# Patient Record
Sex: Male | Born: 1970 | Race: White | Hispanic: No | Marital: Single | Smoking: Never smoker
Health system: Southern US, Community
[De-identification: ages and names within clinical notes are randomized; demographics above are authoritative.]

## PROBLEM LIST (undated history)

## (undated) DIAGNOSIS — J189 Pneumonia, unspecified organism: Secondary | ICD-10-CM

## (undated) DIAGNOSIS — M1990 Unspecified osteoarthritis, unspecified site: Secondary | ICD-10-CM

## (undated) DIAGNOSIS — E7800 Pure hypercholesterolemia, unspecified: Secondary | ICD-10-CM

## (undated) DIAGNOSIS — J45909 Unspecified asthma, uncomplicated: Secondary | ICD-10-CM

## (undated) DIAGNOSIS — F419 Anxiety disorder, unspecified: Secondary | ICD-10-CM

## (undated) DIAGNOSIS — R7303 Prediabetes: Secondary | ICD-10-CM

## (undated) DIAGNOSIS — I10 Essential (primary) hypertension: Secondary | ICD-10-CM

## (undated) DIAGNOSIS — E119 Type 2 diabetes mellitus without complications: Secondary | ICD-10-CM

## (undated) HISTORY — PX: NASAL SINUS SURGERY: SHX719

## (undated) HISTORY — PX: BUNIONECTOMY: SHX129

## (undated) HISTORY — PX: WISDOM TOOTH EXTRACTION: SHX21

---

## 1997-09-26 DIAGNOSIS — F1020 Alcohol dependence, uncomplicated: Secondary | ICD-10-CM

## 1997-09-26 HISTORY — DX: Alcohol dependence, uncomplicated: F10.20

## 2010-09-26 HISTORY — PX: NASAL SINUS SURGERY: SHX719

## 2013-03-31 ENCOUNTER — Emergency Department: Payer: Self-pay | Admitting: Emergency Medicine

## 2014-07-16 IMAGING — CR DG CHEST 2V
1 series · 2 of 2 positions shown · non-contrast
Comparison: none

REASON FOR EXAM: fall
COMMENTS:

PROCEDURE:     DXR - DXR CHEST PA (OR AP) AND LATERAL  - April 01, 2013 [DATE]
RESULT:     The lungs are clear. The heart and pulmonary vessels are normal.
The bony and mediastinal structures are unremarkable. There is no effusion.
There is no pneumothorax or evidence of congestive failure.

[Series 1: w chest pa · 0.14mm/px · 2 of 2 slices shown]
[im 1/2]
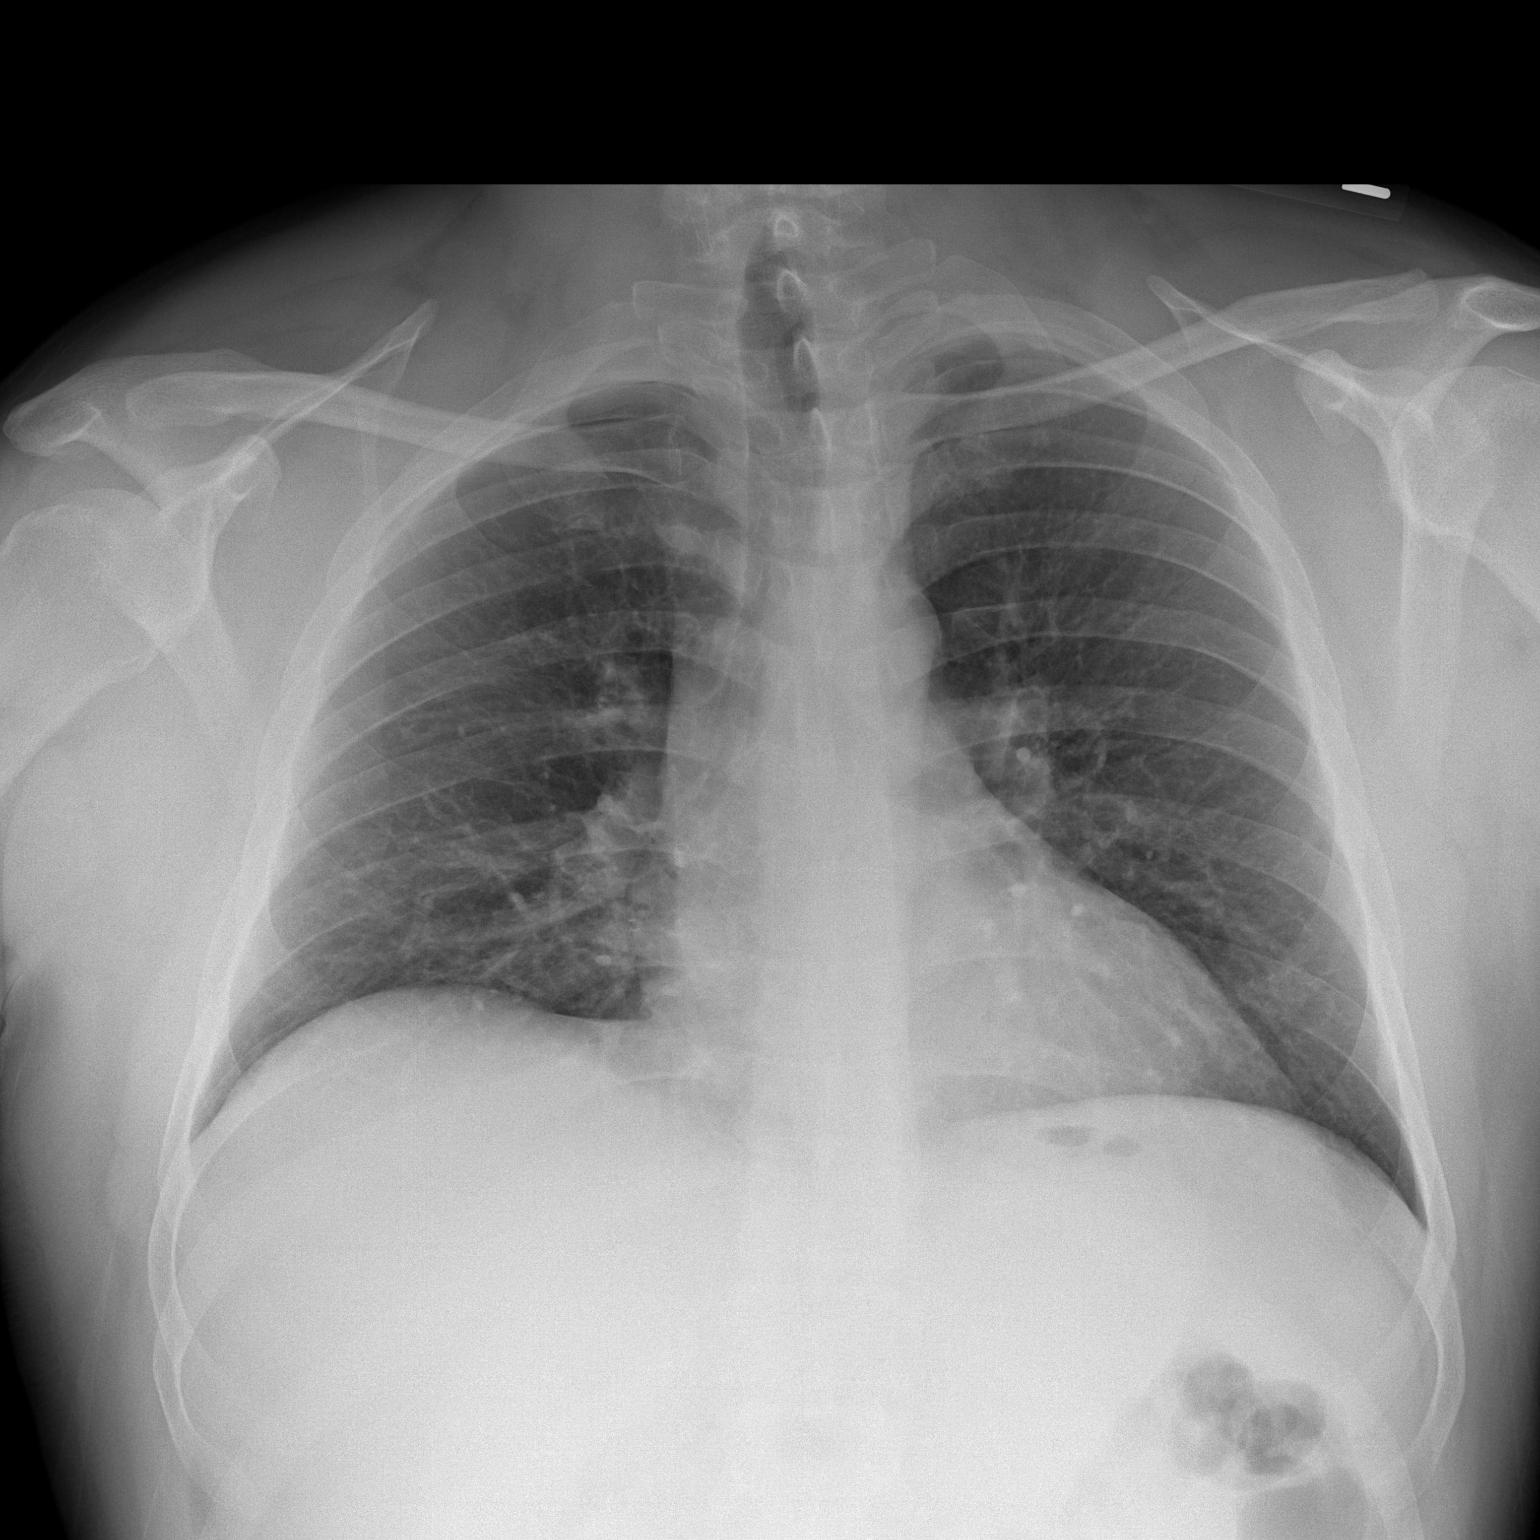
[im 2/2]
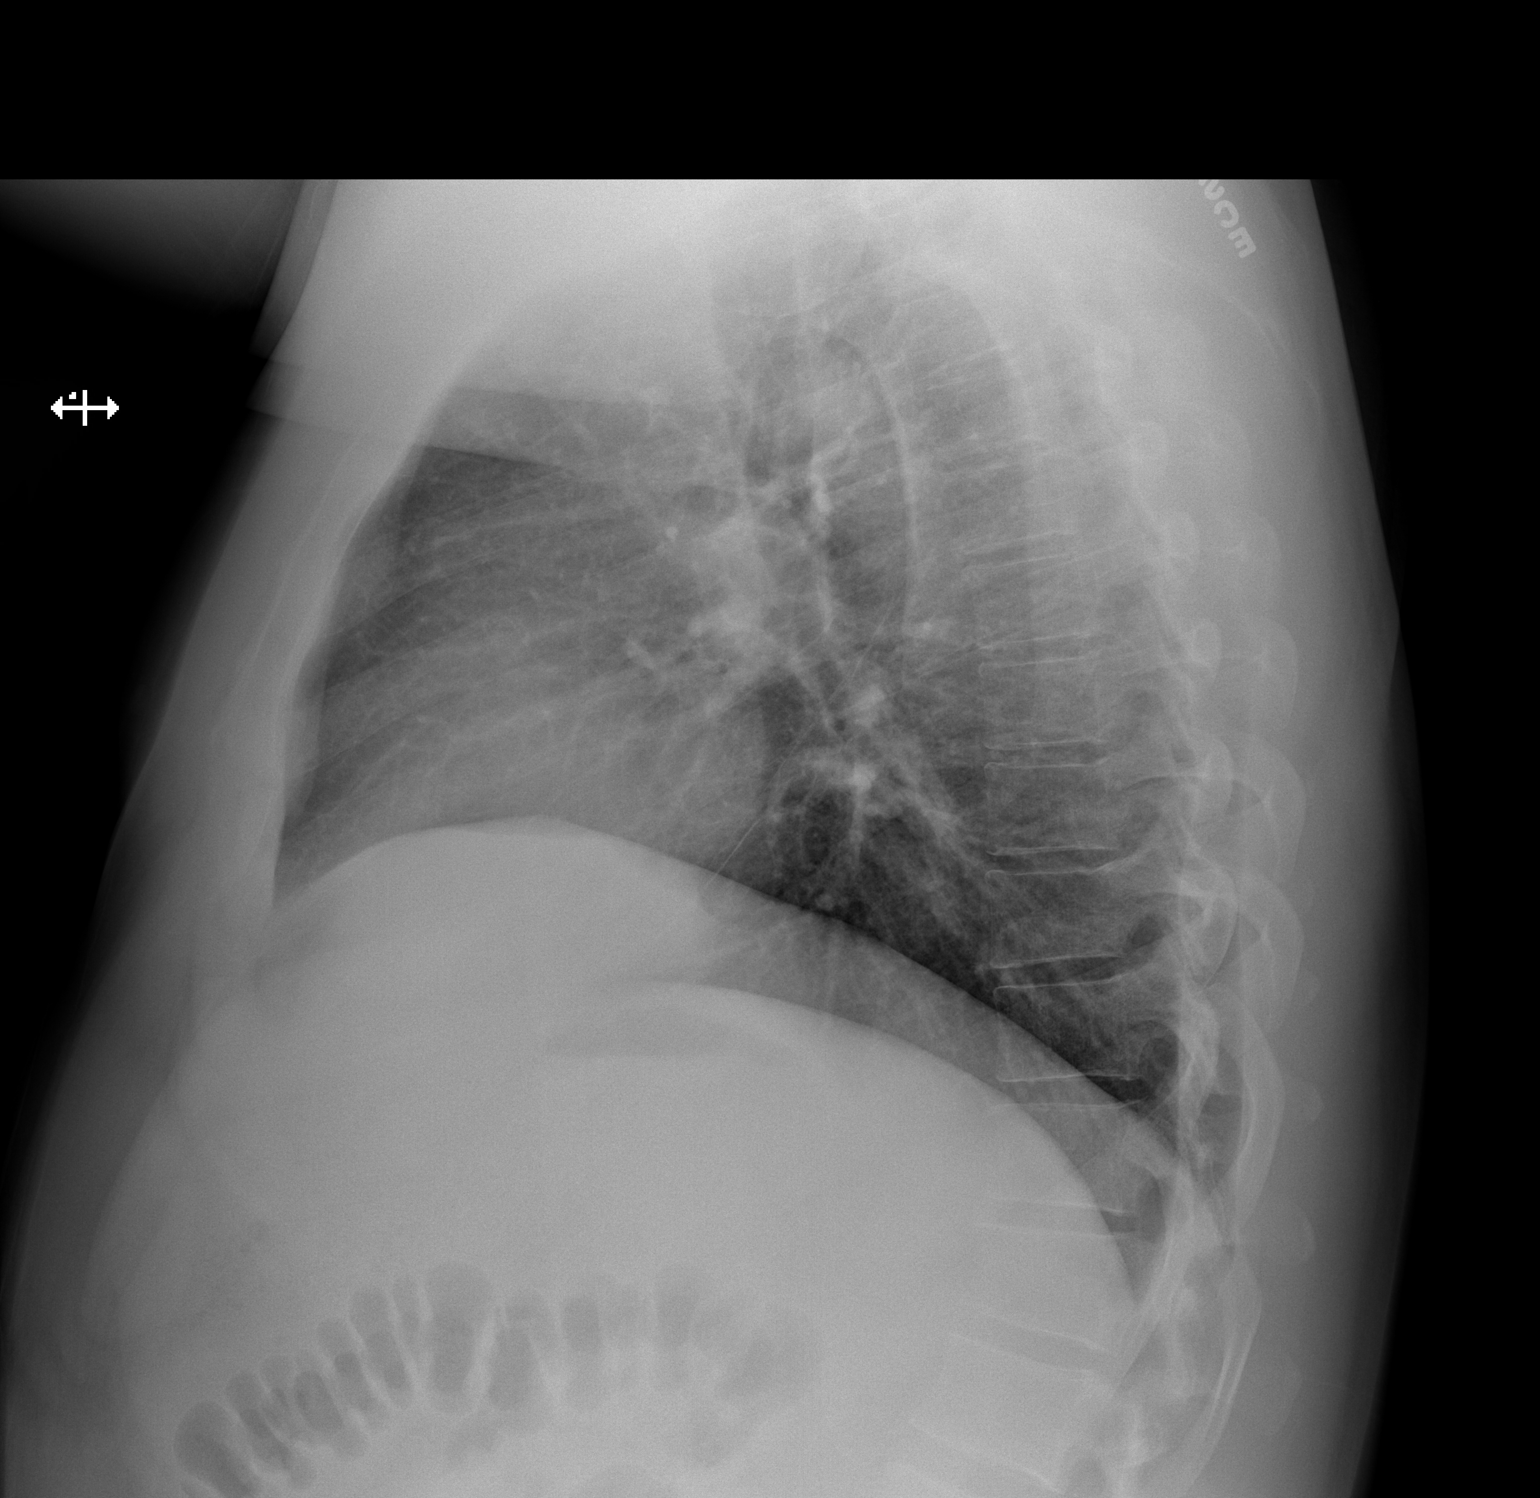

[2 of 2 positions shown; findings below may reference images not displayed]

IMPRESSION: No acute cardiopulmonary disease.

[REDACTED]

## 2017-09-26 HISTORY — PX: MASTECTOMY: SHX3

## 2018-06-27 ENCOUNTER — Emergency Department: Payer: Commercial Managed Care - PPO

## 2018-06-27 ENCOUNTER — Encounter: Payer: Self-pay | Admitting: Emergency Medicine

## 2018-06-27 ENCOUNTER — Emergency Department
Admission: EM | Admit: 2018-06-27 | Discharge: 2018-06-27 | Disposition: A | Discharge status: Home / Self Care | Payer: Commercial Managed Care - PPO | Attending: Emergency Medicine | Admitting: Emergency Medicine

## 2018-06-27 ENCOUNTER — Other Ambulatory Visit: Payer: Self-pay

## 2018-06-27 DIAGNOSIS — W292XXA Contact with other powered household machinery, initial encounter: Secondary | ICD-10-CM | POA: Insufficient documentation

## 2018-06-27 DIAGNOSIS — S61202A Unspecified open wound of right middle finger without damage to nail, initial encounter: Secondary | ICD-10-CM | POA: Diagnosis present

## 2018-06-27 DIAGNOSIS — E119 Type 2 diabetes mellitus without complications: Secondary | ICD-10-CM | POA: Diagnosis not present

## 2018-06-27 DIAGNOSIS — S61241A Puncture wound with foreign body of left index finger without damage to nail, initial encounter: Secondary | ICD-10-CM | POA: Insufficient documentation

## 2018-06-27 DIAGNOSIS — Z7984 Long term (current) use of oral hypoglycemic drugs: Secondary | ICD-10-CM | POA: Insufficient documentation

## 2018-06-27 DIAGNOSIS — Y939 Activity, unspecified: Secondary | ICD-10-CM | POA: Insufficient documentation

## 2018-06-27 DIAGNOSIS — Y929 Unspecified place or not applicable: Secondary | ICD-10-CM | POA: Insufficient documentation

## 2018-06-27 DIAGNOSIS — Y999 Unspecified external cause status: Secondary | ICD-10-CM | POA: Diagnosis not present

## 2018-06-27 DIAGNOSIS — I10 Essential (primary) hypertension: Secondary | ICD-10-CM | POA: Diagnosis not present

## 2018-06-27 DIAGNOSIS — F17220 Nicotine dependence, chewing tobacco, uncomplicated: Secondary | ICD-10-CM | POA: Diagnosis not present

## 2018-06-27 DIAGNOSIS — S61243A Puncture wound with foreign body of left middle finger without damage to nail, initial encounter: Secondary | ICD-10-CM | POA: Insufficient documentation

## 2018-06-27 DIAGNOSIS — S61239A Puncture wound without foreign body of unspecified finger without damage to nail, initial encounter: Secondary | ICD-10-CM

## 2018-06-27 HISTORY — DX: Pure hypercholesterolemia, unspecified: E78.00

## 2018-06-27 HISTORY — DX: Essential (primary) hypertension: I10

## 2018-06-27 HISTORY — DX: Type 2 diabetes mellitus without complications: E11.9

## 2018-06-27 MED ORDER — OXYCODONE-ACETAMINOPHEN 5-325 MG PO TABS
1.0000 | ORAL_TABLET | Freq: Once | ORAL | Status: AC
  Administered 2018-06-27: 1 via ORAL
  Filled 2018-06-27: qty 1

## 2018-06-27 MED ORDER — IBUPROFEN 600 MG PO TABS
600.0000 mg | ORAL_TABLET | Freq: Once | ORAL | Status: AC
  Administered 2018-06-27: 600 mg via ORAL
  Filled 2018-06-27: qty 1

## 2018-06-27 MED ORDER — SULFAMETHOXAZOLE-TRIMETHOPRIM 800-160 MG PO TABS: 1.0000 | ORAL_TABLET | Freq: Two times a day (BID) | ORAL | 0 refills | Status: DC

## 2018-06-27 MED ORDER — NAPROXEN 500 MG PO TABS: 500.0000 mg | ORAL_TABLET | Freq: Two times a day (BID) | ORAL | Status: DC

## 2018-06-27 MED ORDER — LIDOCAINE HCL (PF) 1 % IJ SOLN
INTRAMUSCULAR | Status: AC
  Administered 2018-06-27: 5 mL
  Filled 2018-06-27: qty 5

## 2018-06-27 MED ORDER — BACITRACIN-NEOMYCIN-POLYMYXIN 400-5-5000 EX OINT
TOPICAL_OINTMENT | Freq: Once | CUTANEOUS | Status: AC
  Administered 2018-06-27: 13:00:00 via TOPICAL
  Filled 2018-06-27: qty 1

## 2018-06-27 MED ORDER — LIDOCAINE HCL (PF) 1 % IJ SOLN
5.0000 mL | Freq: Once | INTRAMUSCULAR | Status: AC
  Administered 2018-06-27: 5 mL

## 2018-06-27 NOTE — ED Notes (Signed)
Left hand soaked in antibacterial soap and normal saline for approximately 10 minutes. Hand then dried, neosporin applied to wounds, and then wrapped in gauze and gauze wrap. Pt tolerated well. Visitor at bedside.

## 2018-06-27 NOTE — ED Provider Notes (Signed)
[facility] Emergency Department Provider Note   ____________________________________________   First MD Initiated Contact with Patient 06/27/18 1151     (approximate)  I have reviewed the triage vital signs and the nursing notes.   HISTORY  Chief Complaint Foreign Body in Skin    HPI Tyreck Bell is a 47 y.o. male patient presents with puncture wounds to the index and third digit left hand.  Patient stated drill bit and screw went into the index finger.  Patient denies loss sensation loss of function.  Patient states tetanus shot is up-to-date.  Patient rates pain as a 7/10.  Patient described the pain is "aching".  No palliative measures except for pressure dressing prior to arrival.  Past Medical History:  Diagnosis Date  . Diabetes mellitus without complication ([place])   . Hypercholesteremia   . Hypertension     There are no active problems to display for this patient.   History reviewed. No pertinent surgical history.  Prior to Admission medications   Medication Sig Start Date End Date Taking? Authorizing Provider  atorvastatin (LIPITOR) 20 MG tablet Take 20 mg by mouth daily.   Yes [provider]  lisinopril (PRINIVIL,ZESTRIL) 20 MG tablet Take 20 mg by mouth daily.   Yes [provider]  metFORMIN (GLUCOPHAGE) 1000 MG tablet Take 1,000 mg by mouth 2 (two) times daily with a meal.   Yes [provider]    Allergies Patient has no known allergies.  No family history on file.  Social History Social History   Tobacco Use  . Smoking status: Never Smoker  . Smokeless tobacco: Current User    Types: Chew  Substance Use Topics  . Alcohol use: Yes  . Drug use: Not on file    Review of Systems Constitutional: No fever/chills Eyes: No visual changes. ENT: No sore throat. Cardiovascular: Denies chest pain. Respiratory: Denies shortness of breath. Gastrointestinal: No abdominal pain.  No nausea, no vomiting.   No diarrhea.  No constipation. Genitourinary: Negative for dysuria. Musculoskeletal: Negative for back pain. Skin: Negative for rash.  Puncture wounds to second and third digit left hand. Neurological: Negative for headaches, focal weakness or numbness. Endocrine:Diabetes, hyperlipidemia, and hypertension. ____________________________________________   PHYSICAL EXAM:  VITAL SIGNS: ED Triage Vitals  Enc Vitals Group     BP 06/27/18 1147 139/88     Pulse Rate 06/27/18 1147 90     Resp 06/27/18 1147 18     Temp 06/27/18 1147 98.6 F (37 C)     Temp Source 06/27/18 1147 Oral     SpO2 06/27/18 1147 98 %     Weight 06/27/18 1148 220 lb (99.8 kg)     Height 06/27/18 1148 5\' 6"  (1.676 m)     Head Circumference --      Peak Flow --      Pain Score 06/27/18 1148 7     Pain Loc --      Pain Edu? --      Excl. in [place]? --     Constitutional: Alert and oriented. Well appearing and in no acute distress. Hematological/Lymphatic/Immunilogical: No cervical lymphadenopathy. Cardiovascular: Normal rate, regular rhythm. Grossly normal heart sounds.  Good peripheral circulation. Respiratory: Normal respiratory effort.  No retractions. Lungs CTAB. Musculoskeletal: No lower extremity tenderness nor edema.  No joint effusions. Neurologic:  Normal speech and language. No gross focal neurologic deficits are appreciated. No gait instability. Skin:  Skin is warm, dry and intact. No rash noted.  Puncture  wounds to the volar aspect of the second and third digit left hand.  No visible/palpable foreign body. Psychiatric: Mood and affect are normal. Speech and behavior are normal.  ____________________________________________   LABS (all labs ordered are listed, but only abnormal results are displayed)  Labs Reviewed - No data to display ____________________________________________  EKG   ____________________________________________  RADIOLOGY  ED MD interpretation:    Official radiology  report(s): Dg Hand Complete Left  Result Date: 06/27/2018 CLINICAL DATA:  Drill bit and screw injury to left index and middle fingers. EXAM: LEFT HAND - COMPLETE 3+ VIEW COMPARISON:  None. FINDINGS: No fracture. No underlying bony defect. Soft tissue laceration noted proximal middle finger. Tiny radiodense foreign bodies are identified in the region of the laceration of may be on the skin or in the soft tissues. These measure 1 mm or less in size. IMPRESSION: 1. No acute bony abnormality. 2. Extremely tiny radiodense foreign bodies in or on the soft tissues of the proximal middle finger. Electronically Signed   By: Misty Stanley M.D.   On: 06/27/2018 12:29    ____________________________________________   PROCEDURES  Procedure(s) performed: None  Procedures  Critical Care performed: No  ____________________________________________   INITIAL IMPRESSION / ASSESSMENT AND PLAN / ED COURSE  As part of my medical decision making, I reviewed the following data within the electronic MEDICAL RECORD NUMBER    Puncture wounds of the second and third digit left hand.  Microscopic foreign body identified in the third digit.  Discussed x-ray 5 with patient.  Discussed rationale for for not exploring and removing microscopic foreign body.  Patient given discharge care instruction advised take medication as directed.  Patient advised to my ED if condition worsens.      ____________________________________________   FINAL CLINICAL IMPRESSION(S) / ED DIAGNOSES  Final diagnoses:  None     ED Discharge Orders    None       Note:  This document was prepared using Dragon voice recognition software and may include unintentional dictation errors.    Sable Feil, PA-C 06/27/18 1335    Carrie Mew, MD 06/30/18 [phone]

## 2018-06-27 NOTE — Discharge Instructions (Addendum)
Discussed with patient rationale for not suturing puncture wounds.  Patient has a microscopic foreign body visible on x-ray.  Follow discharge care instructions and take medications as directed.

## 2018-06-27 NOTE — ED Triage Notes (Signed)
Presents with injury to left hand  States a drill bit and screw went into left index and middle fingers

## 2019-10-11 IMAGING — DX DG HAND COMPLETE 3+V*L*
3 series · 3 of 3 positions shown · non-contrast
Comparison: None.

CLINICAL DATA: Drill bit and screw injury to left index and middle
fingers.

EXAM:
LEFT HAND - COMPLETE 3+ VIEW

[hand ap]
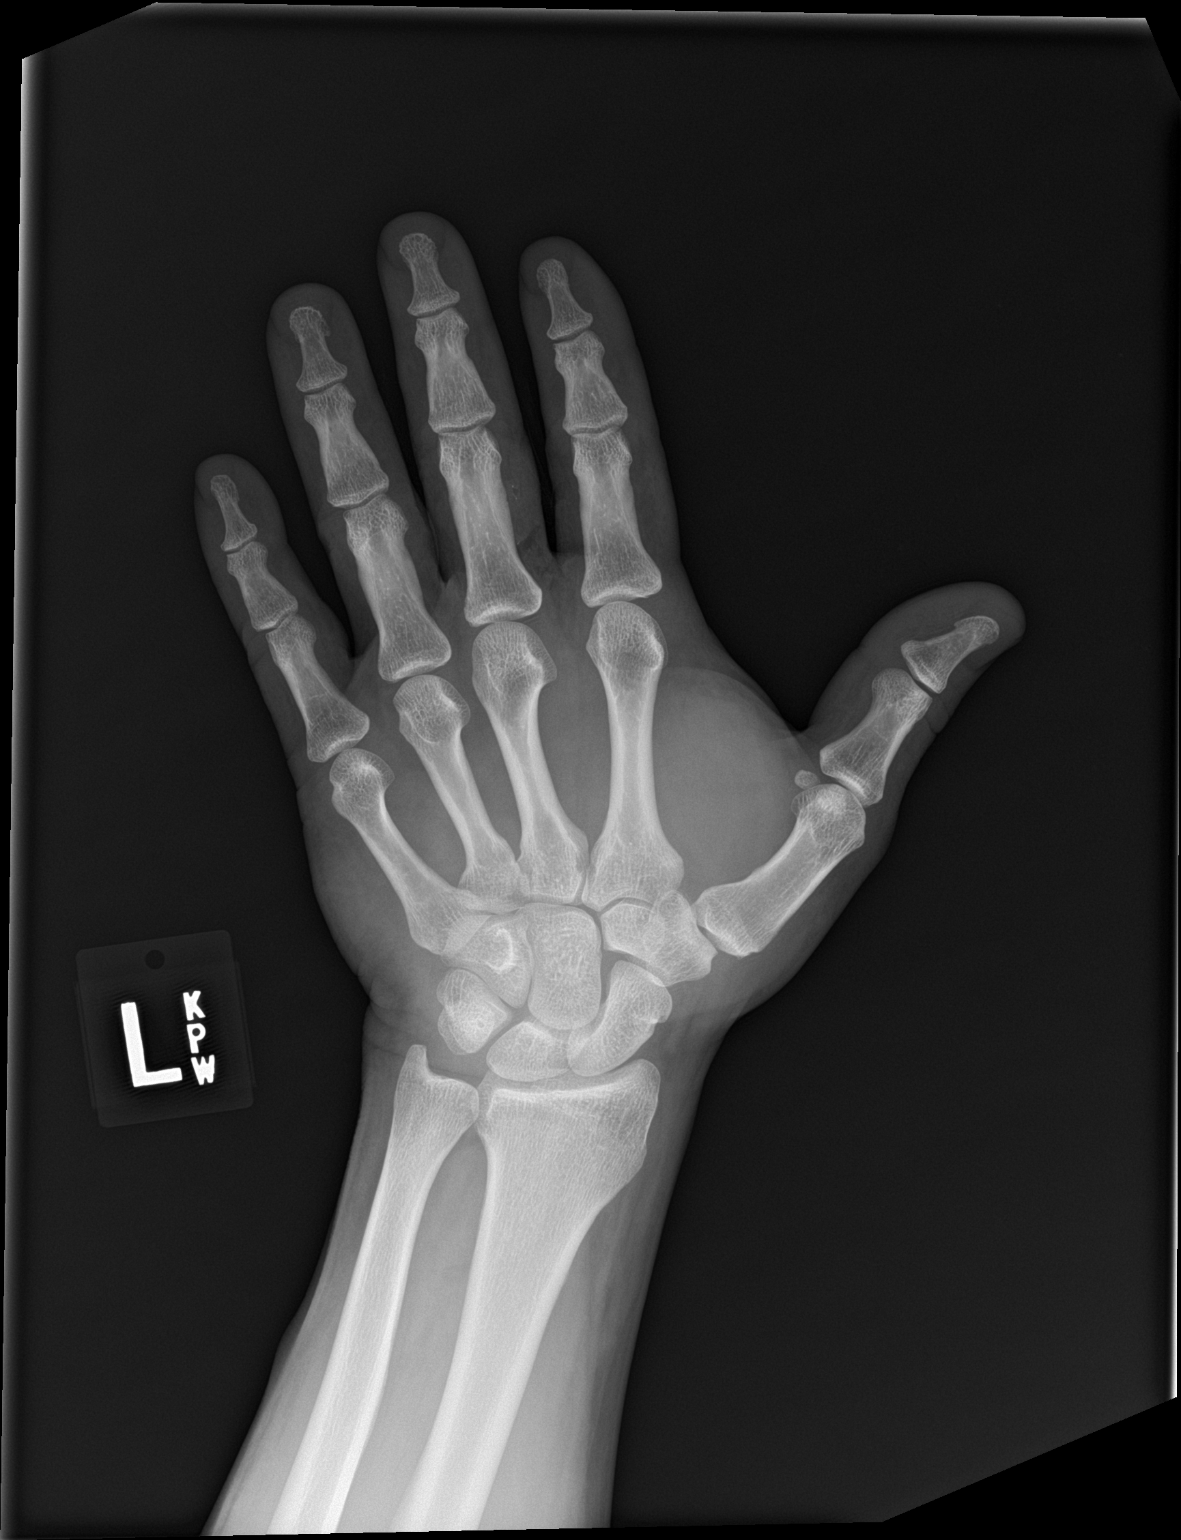

[hand obl]
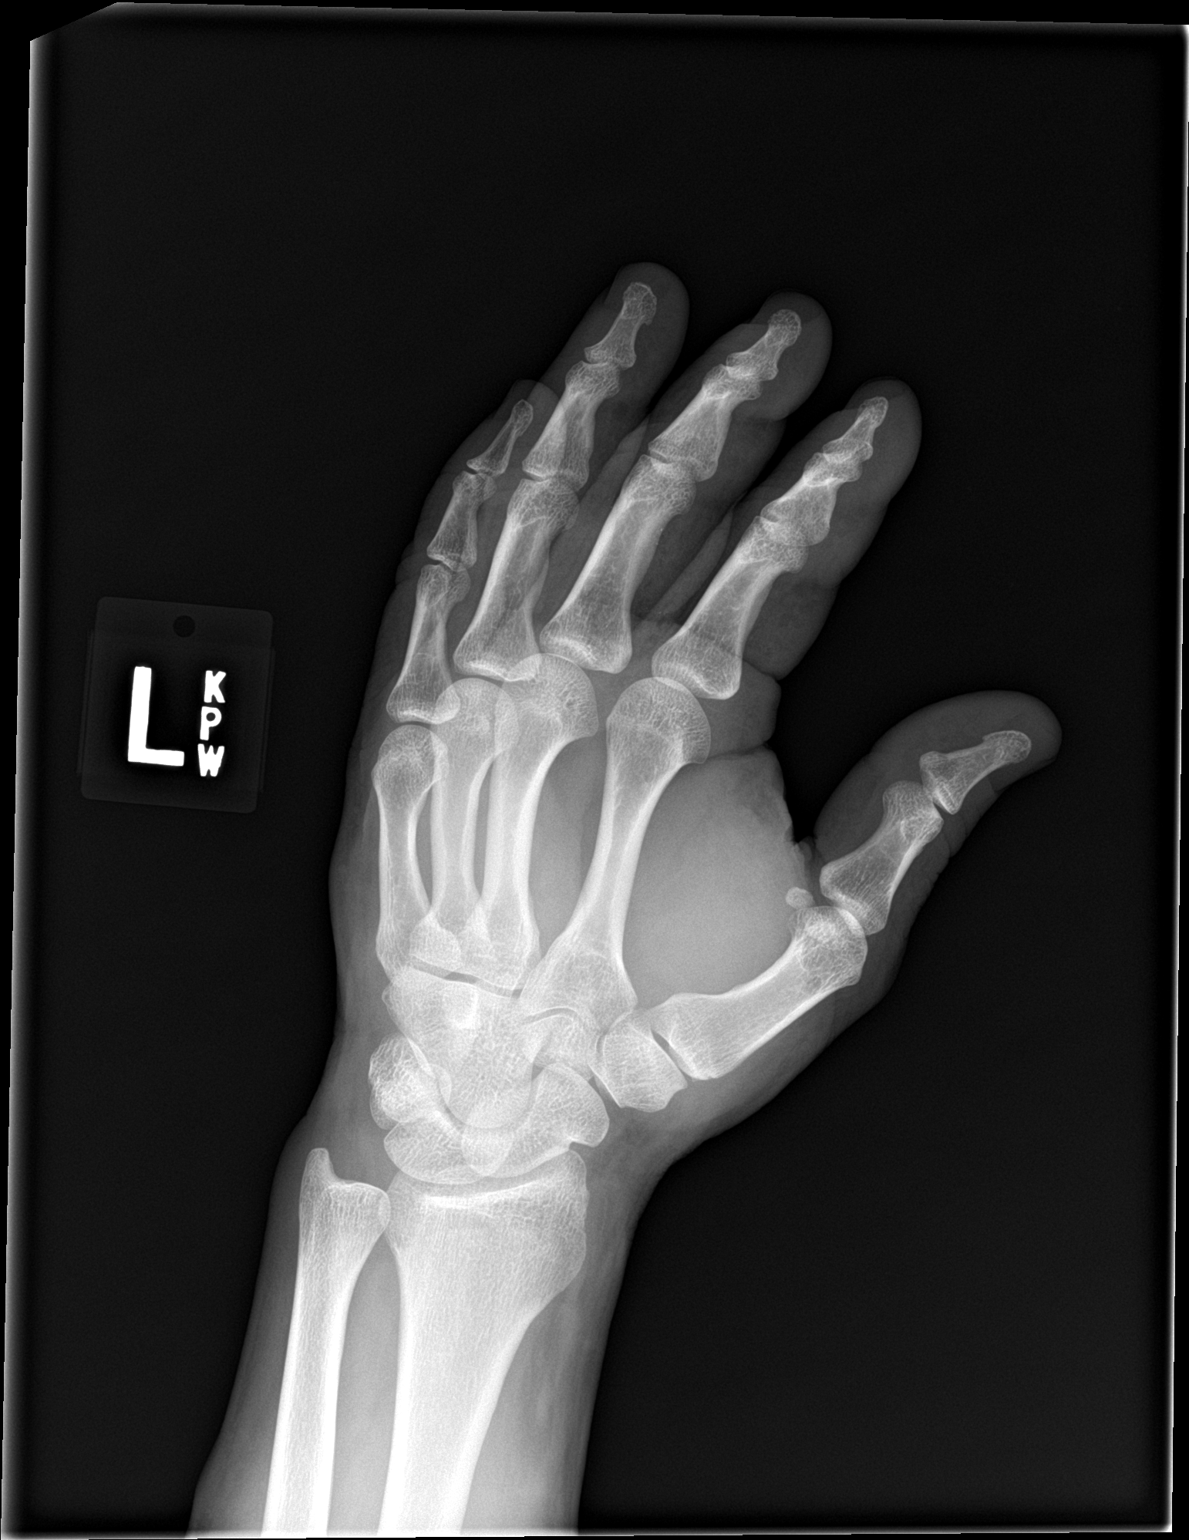

[hand lat]
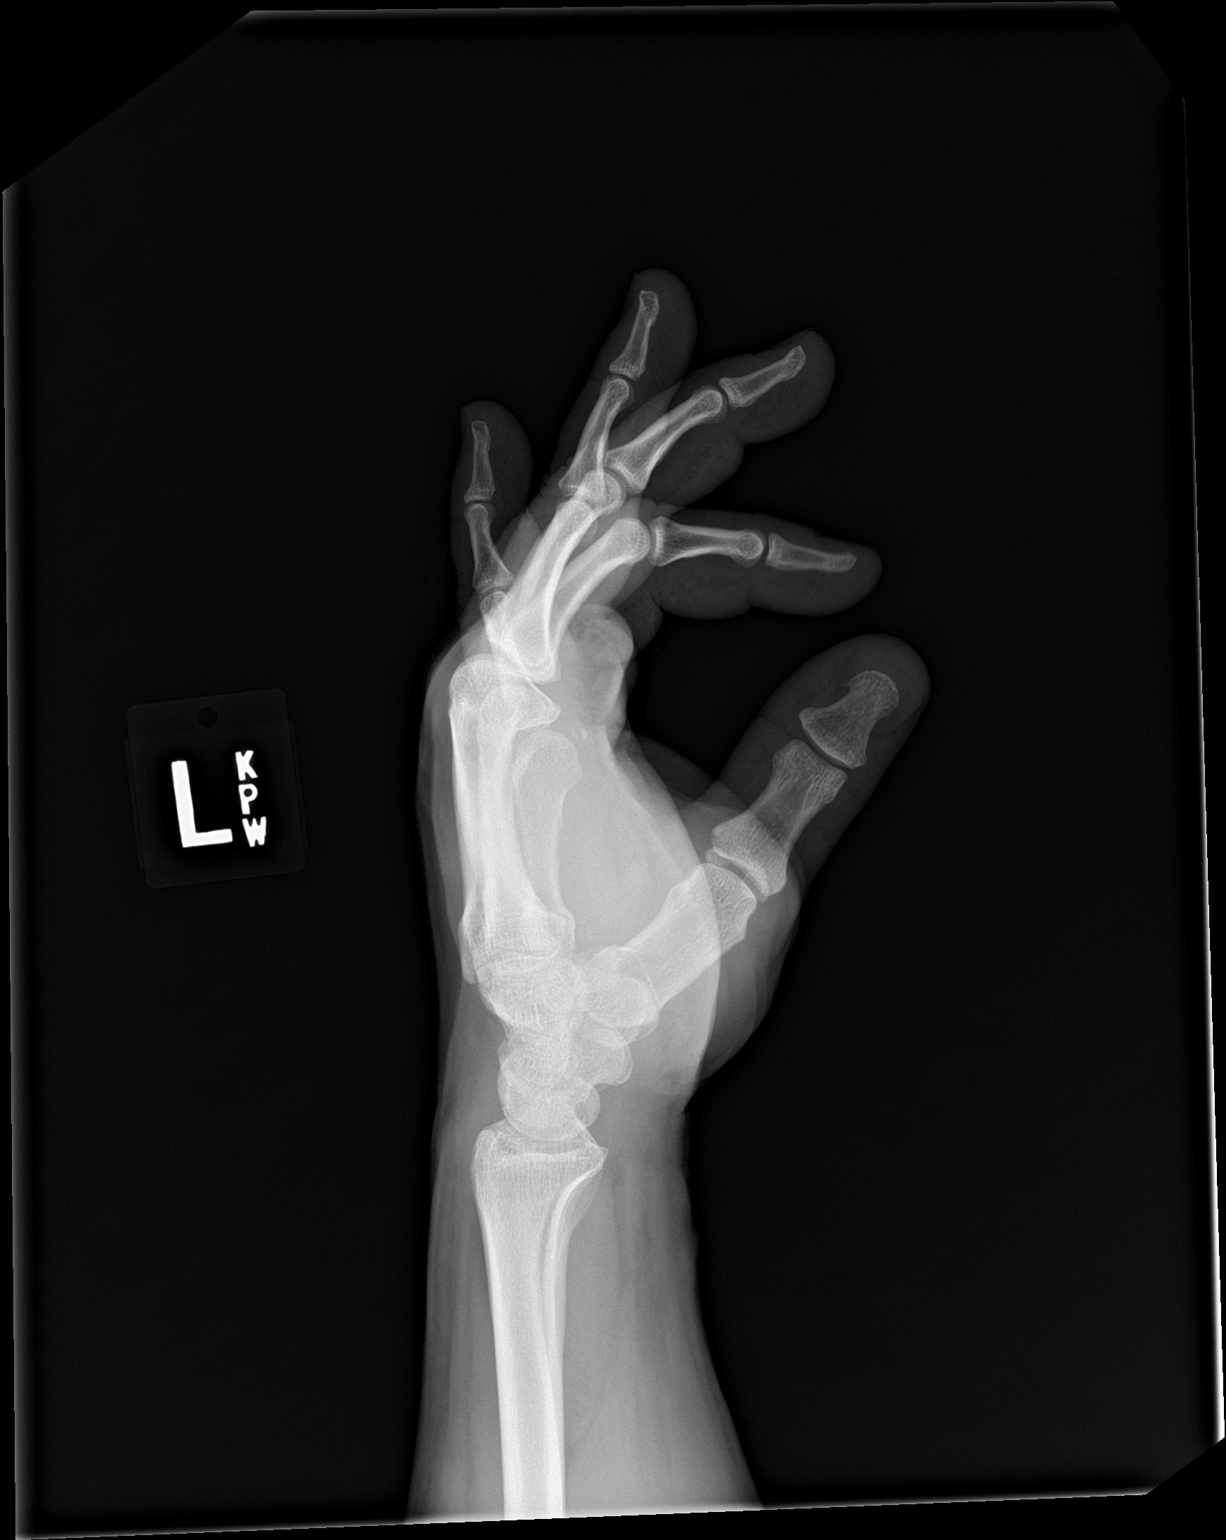

[3 of 3 positions shown; findings below may reference images not displayed]

FINDINGS: No fracture. No underlying bony defect. Soft tissue laceration noted
proximal middle finger. Tiny radiodense foreign bodies are
identified in the region of the laceration [DATE] be on the skin or
in the soft tissues. These measure 1 mm or less in size.
IMPRESSION: 1. No acute bony abnormality.
2. Extremely tiny radiodense foreign bodies in or on the soft
tissues of the proximal middle finger.

## 2022-08-08 ENCOUNTER — Encounter: Payer: Self-pay | Admitting: Gastroenterology

## 2022-09-12 ENCOUNTER — Encounter: Payer: Self-pay | Admitting: Gastroenterology

## 2022-09-12 ENCOUNTER — Ambulatory Visit (INDEPENDENT_AMBULATORY_CARE_PROVIDER_SITE_OTHER): Payer: Managed Care, Other (non HMO) | Admitting: Gastroenterology

## 2022-09-12 VITALS — BP 150/90 | HR 120 | Ht 66.0 in | Wt 214.5 lb

## 2022-09-12 DIAGNOSIS — Z1211 Encounter for screening for malignant neoplasm of colon: Secondary | ICD-10-CM | POA: Diagnosis not present

## 2022-09-12 DIAGNOSIS — Z1212 Encounter for screening for malignant neoplasm of rectum: Secondary | ICD-10-CM | POA: Diagnosis not present

## 2022-09-12 MED ORDER — NA SULFATE-K SULFATE-MG SULF 17.5-3.13-1.6 GM/177ML PO SOLN: 1.0000 | Freq: Once | ORAL | 0 refills | Status: AC

## 2022-09-12 NOTE — Progress Notes (Signed)
HPI : Scott Henry is a very pleasant 51 year old male with history of diabetes, hypertension and hyperlipidemia who is referred to [place] by Dr. Georgina Peer for initial screening colonoscopy.  The patient has no family history of colon cancer.  The patient had recent change in bowel habits since starting Ozempic about 2 years ago.  The patient has more frequent bowel movements, sometimes loose or watery in consistency.  On average she has a bowel movement about 3 times per day.  Constipation is not a problem for him.  No problems with abdominal pain.  He reports seeing blood in his stool many months ago which lasted a couple days and then resolved.  He has not seen blood since.  At the time he is seeing blood, he denies any other symptoms such as abdominal pain, nausea/vomiting or lightheadedness/weakness/dizziness.  His weight is stable. He denies any chronic upper GI symptoms to include frequent heartburn/acid regurgitation, dysphagia, early satiety or nausea/vomiting.    Past Medical History:  Diagnosis Date   Diabetes mellitus without complication ([place])    Hypercholesteremia    Hypertension      Past Surgical History:  Procedure Laterality Date   BUNIONECTOMY Bilateral    NASAL SINUS SURGERY     Family History  Problem Relation Age of Onset   Lung cancer Mother    High blood pressure Mother    Prostate cancer Father    Heart disease Father    Social History   Tobacco Use   Smoking status: Never   Smokeless tobacco: Current    Types: Chew  Substance Use Topics   Alcohol use: Yes   Current Outpatient Medications  Medication Sig Dispense Refill   atorvastatin (LIPITOR) 20 MG tablet Take 20 mg by mouth daily.     escitalopram (LEXAPRO) 10 MG tablet TAKE ONE AND A HALF TABLETS (15 MG DOSE) BY MOUTH DAILY.     fluticasone (FLONASE) 50 MCG/ACT nasal spray USE 1 SPRAY IN EACH NOSTRIL EVERY DAY AS DIRECTED     ketoconazole (NIZORAL) 2 % cream APPLY TOPICALLY EVERY DAY AS NEEDED      lisinopril (PRINIVIL,ZESTRIL) 20 MG tablet Take 20 mg by mouth daily.     metFORMIN (GLUCOPHAGE) 1000 MG tablet Take 1,000 mg by mouth 2 (two) times daily with a meal.     naproxen (NAPROSYN) 500 MG tablet Take 1 tablet (500 mg total) by mouth 2 (two) times daily with a meal. 20 tablet 00   Semaglutide, 1 MG/DOSE, (OZEMPIC, 1 MG/DOSE,) 4 MG/3ML SOPN      No current facility-administered medications for this visit.   No Known Allergies   Review of Systems: All systems reviewed and negative except where noted in HPI.    No results found.  Physical Exam: BP (!) 150/90 (BP Location: Left Arm, Patient Position: Sitting, Cuff Size: Large)   Pulse (!) 120   Ht '5\' 6"'$  (1.676 m)   Wt 214 lb 8 oz (97.3 kg)   SpO2 97%   BMI 34.62 kg/m  Constitutional: Pleasant,well-developed, Caucasian male in no acute distress. HEENT: Normocephalic and atraumatic. Conjunctivae are normal. No scleral icterus. Neck supple.  Cardiovascular: Tachycardic, regular rhythm.  No murmur Pulmonary/chest: Effort normal and breath sounds normal. No wheezing, rales or rhonchi. Abdominal: Soft, nondistended, nontender. Bowel sounds active throughout. There are no masses palpable. No hepatomegaly. Extremities: no edema Lymphadenopathy: No cervical adenopathy noted. Neurological: Alert and oriented to person place and time. Skin: Skin is warm and dry. No rashes  noted. Psychiatric: Normal mood and affect. Behavior is normal.  Seems anxious  CBC No results found for: "WBC", "RBC", "HGB", "HCT", "PLT", "MCV", "MCH", "MCHC", "RDW", "LYMPHSABS", "MONOABS", "EOSABS", "BASOSABS"  CMP  No results found for: "NA", "K", "CL", "CO2", "GLUCOSE", "BUN", "CREATININE", "CALCIUM", "PROT", "ALBUMIN", "AST", "ALT", "ALKPHOS", "BILITOT", "GFRNONAA", "GFRAA"   ASSESSMENT AND PLAN: 51 year old male due for initial after screening colonoscopy.  He has some changes in his bowel habits when he started Ozempic, manifested by increased  stool frequency and occasional loose or watery stools.  Otherwise, no chronic GI symptoms.  Given the correlation of his mild GI symptoms with the new medication, do not think any further evaluation for this is necessary.  Will schedule patient for initial average risk screening colonoscopy.  With regards to the patient's tachycardia, the patient states that he is very nervous.  He does not normally have a fast heart rate.  Colon cancer screening -Colonoscopy  The details, risks (including bleeding, perforation, infection, missed lesions, medication reactions and possible hospitalization or surgery if complications occur), benefits, and alternatives to colonoscopy with possible biopsy and possible polypectomy were discussed with the patient and he consents to proceed.   Ashish Rossetti E. Candis Schatz, MD [facility]  CC:  Elenore Paddy, MD

## 2022-09-12 NOTE — Patient Instructions (Addendum)
_______________________________________________________  If you are age 51 or older, your body mass index should be between 23-30. Your Body mass index is 34.62 kg/m. If this is out of the aforementioned range listed, please consider follow up with your Primary Care Provider.  If you are age 78 or younger, your body mass index should be between 19-25. Your Body mass index is 34.62 kg/m. If this is out of the aformentioned range listed, please consider follow up with your Primary Care Provider.   You have been scheduled for a colonoscopy. Please follow written instructions given to you at your visit today.  Please pick up your prep supplies at the pharmacy within the next 1-3 days. If you use inhalers (even only as needed), please bring them with you on the day of your procedure.   The [facility] providers would like to encourage you to use [facility] to communicate with providers for non-urgent requests or questions.  Due to long hold times on the telephone, sending your provider a message by [facility] may be a faster and more efficient way to get a response.  Please allow 48 business hours for a response.  Please remember that this is for non-urgent requests.   It was a pleasure to see you today!  Thank you for trusting me with your gastrointestinal care!    Scott E.Candis Schatz , MD

## 2022-10-20 ENCOUNTER — Emergency Department: Payer: Managed Care, Other (non HMO)

## 2022-10-20 ENCOUNTER — Other Ambulatory Visit: Payer: Self-pay

## 2022-10-20 ENCOUNTER — Emergency Department
Admission: EM | Admit: 2022-10-20 | Discharge: 2022-10-21 | Disposition: A | Discharge status: Home / Self Care | Payer: Managed Care, Other (non HMO) | Attending: Student in an Organized Health Care Education/Training Program | Admitting: Student in an Organized Health Care Education/Training Program

## 2022-10-20 DIAGNOSIS — F10929 Alcohol use, unspecified with intoxication, unspecified: Secondary | ICD-10-CM

## 2022-10-20 DIAGNOSIS — I4581 Long QT syndrome: Secondary | ICD-10-CM | POA: Insufficient documentation

## 2022-10-20 DIAGNOSIS — Y908 Blood alcohol level of 240 mg/100 ml or more: Secondary | ICD-10-CM | POA: Insufficient documentation

## 2022-10-20 DIAGNOSIS — E876 Hypokalemia: Secondary | ICD-10-CM | POA: Insufficient documentation

## 2022-10-20 DIAGNOSIS — F10129 Alcohol abuse with intoxication, unspecified: Secondary | ICD-10-CM | POA: Diagnosis present

## 2022-10-20 DIAGNOSIS — R9431 Abnormal electrocardiogram [ECG] [EKG]: Secondary | ICD-10-CM

## 2022-10-20 LAB — CBC
HCT: 44.9 % (ref 39.0–52.0)
Hemoglobin: 15.4 g/dL (ref 13.0–17.0)
MCH: 31.9 pg (ref 26.0–34.0)
MCHC: 34.3 g/dL (ref 30.0–36.0)
MCV: 93 fL (ref 80.0–100.0)
Platelets: 293 10*3/uL (ref 150–400)
RBC: 4.83 MIL/uL (ref 4.22–5.81)
RDW: 13.9 % (ref 11.5–15.5)
WBC: 5.1 10*3/uL (ref 4.0–10.5)
nRBC: 0 % (ref 0.0–0.2)

## 2022-10-20 LAB — MAGNESIUM: Magnesium: 2.2 mg/dL (ref 1.7–2.4)

## 2022-10-20 LAB — COMPREHENSIVE METABOLIC PANEL
ALT: 33 U/L (ref 0–44)
AST: 44 U/L — ABNORMAL HIGH (ref 15–41)
Albumin: 4.5 g/dL (ref 3.5–5.0)
Alkaline Phosphatase: 40 U/L (ref 38–126)
Anion gap: 9 (ref 5–15)
BUN: 11 mg/dL (ref 6–20)
CO2: 25 mmol/L (ref 22–32)
Calcium: 8.6 mg/dL — ABNORMAL LOW (ref 8.9–10.3)
Chloride: 105 mmol/L (ref 98–111)
Creatinine, Ser: 0.68 mg/dL (ref 0.61–1.24)
GFR, Estimated: >60 mL/min (ref >60)
Glucose, Bld: 129 mg/dL — ABNORMAL HIGH (ref 70–99)
Potassium: 2.9 mmol/L — ABNORMAL LOW (ref 3.5–5.1)
Sodium: 139 mmol/L (ref 135–145)
Total Bilirubin: 0.7 mg/dL (ref 0.3–1.2)
Total Protein: 7.7 g/dL (ref 6.5–8.1)

## 2022-10-20 LAB — ETHANOL: Alcohol, Ethyl (B): 398 mg/dL (ref <10)

## 2022-10-20 MED ORDER — POTASSIUM CHLORIDE CRYS ER 20 MEQ PO TBCR
40.0000 meq | EXTENDED_RELEASE_TABLET | Freq: Once | ORAL | Status: AC
  Administered 2022-10-21: 40 meq via ORAL
  Filled 2022-10-20: qty 2

## 2022-10-20 MED ORDER — SODIUM CHLORIDE 0.9 % IV BOLUS
1000.0000 mL | Freq: Once | INTRAVENOUS | Status: AC
  Administered 2022-10-20: 1000 mL via INTRAVENOUS

## 2022-10-20 NOTE — ED Provider Notes (Signed)
[facility] Provider Note    Event Date/Time   First MD Initiated Contact with Patient 10/20/22 2116     (approximate)   History   Alcohol Intoxication   HPI  Scott Henry is a 52 y.o. male history of alcohol abuse presents to the ER intoxicated after being found to some fall in his yard.  Was unwitnessed but fiance found him lying on the lawn.  Patient states that he is "here for colonoscopy.  "He says that he is having 1 done this coming week.  Patient unable to provide much additional history.  Fully drank a pint of whiskey.     Physical Exam   Triage Vital Signs: ED Triage Vitals  Enc Vitals Group     BP 10/20/22 1934 123/87     Pulse Rate 10/20/22 1934 78     Resp 10/20/22 1934 18     Temp 10/20/22 1934 98 F (36.7 C)     Temp Source 10/20/22 1934 Oral     SpO2 10/20/22 1934 97 %     Weight 10/20/22 1934 214 lb (97.1 kg)     Height 10/20/22 1934 '5\' 7"'$  (1.702 m)     Head Circumference --      Peak Flow --      Pain Score 10/20/22 1947 0     Pain Loc --      Pain Edu? --      Excl. in [place]? --     Most recent vital signs: Vitals:   10/20/22 1934 10/20/22 2226  BP: 123/87 (!) 81/41  Pulse: 78 63  Resp: 18 20  Temp: 98 F (36.7 C)   SpO2: 97% 96%     Constitutional: Awakens to voice but slurring speech and heavily intoxicated. Eyes: Conjunctivae are normal.  Head: Atraumatic. Nose: No congestion/rhinnorhea. Mouth/Throat: Mucous membranes are moist.   Neck: Painless ROM.  Cardiovascular:   Good peripheral circulation. Respiratory: Normal respiratory effort.  No retractions.  Gastrointestinal: Soft and nontender.  Musculoskeletal:  no deformity Neurologic:  MAE spontaneously. No gross focal neurologic deficits are appreciated.  Skin:  Skin is warm, dry and intact. No rash noted. Psychiatric: Mood and affect are normal. Speech and behavior are normal.    ED Results / Procedures / Treatments   Labs (all labs ordered are  listed, but only abnormal results are displayed) Labs Reviewed  COMPREHENSIVE METABOLIC PANEL - Abnormal; Notable for the following components:      Result Value   Potassium 2.9 (*)    Glucose, Bld 129 (*)    Calcium 8.6 (*)    AST 44 (*)    All other components within normal limits  ETHANOL - Abnormal; Notable for the following components:   Alcohol, Ethyl (B) 398 (*)    All other components within normal limits  CBC  URINE DRUG SCREEN, QUALITATIVE ([facility] ONLY)     EKG     RADIOLOGY Please see ED Course for my review and interpretation.  I personally reviewed all radiographic images ordered to evaluate for the above acute complaints and reviewed radiology reports and findings.  These findings were personally discussed with the patient.  Please see medical record for radiology report.    PROCEDURES:  Critical Care performed: No  Procedures   MEDICATIONS ORDERED IN ED: Medications  sodium chloride 0.9 % bolus 1,000 mL (has no administration in time range)     IMPRESSION / MDM / ASSESSMENT AND PLAN / ED COURSE  I  reviewed the triage vital signs and the nursing notes.                              Differential diagnosis includes, but is not limited to, intoxication, SDH, IPH, fracture, CVA, electrolyte abnormality  Patient presenting to the ER for evaluation of symptoms as described above.  Based on symptoms, risk factors and considered above differential, this presenting complaint could reflect a potentially life-threatening illness therefore the patient will be placed on continuous pulse oximetry and telemetry for monitoring.  Laboratory evaluation will be sent to evaluate for the above complaints.  Patient currently is heavily intoxicated.  CT imaging will be ordered to evaluate for possible neuro injury.  Will observe here in the ER. Clinical Course as of 10/20/22 2230  Thu Oct 20, 2022  2229 CT head on my review and interpretation without evidence of IPH fracture.   Patient sleeping currently.  Noted to have low blood pressure.  Will order IV fluid bolus.  SPECT this blood pressure is low secondary to patient being asleep but also intoxicated.  He is following commands.  He is protecting his airway.  Patient will be signed out to oncoming physician pending reassessment. [PR]    Clinical Course User Index [PR] Merlyn Lot, MD     FINAL CLINICAL IMPRESSION(S) / ED DIAGNOSES   Final diagnoses:  Alcoholic intoxication with complication [facility])     Rx / DC Orders   ED Discharge Orders     None        Note:  This document was prepared using Dragon voice recognition software and may include unintentional dictation errors.    Merlyn Lot, MD 10/20/22 2231

## 2022-10-20 NOTE — ED Notes (Addendum)
First nurse- pt brought in via ems from home.  Pt had a syncopal episode with etoh use today.  Bp-141/83, p 83, oxygen sats 96%, fsbs 118 per ems.  Iv in place

## 2022-10-20 NOTE — ED Triage Notes (Addendum)
Pt comes from home via [facility] with c/o alcohol intoxication. Pt states he drank about a pint of whiskey. Pts fiance states he was out laying in the lawn. Pt in NAD at this time. Denies any CP, SOB

## 2022-10-20 NOTE — ED Provider Notes (Signed)
11:30 PM  Assumed care at shift change.  Patient here with alcohol intoxication.  Alcohol level of 398.  Potassium of 2.9.  Will give oral replacement.  Will add on magnesium level.  Patient will need to be monitored until clinically sober.  1:20 AM  Pt's EKG shows a slightly prolonged QT interval.  Will give oral and IV potassium replacement and recheck EKG.     EKG Interpretation  Date/Time:  Friday October 21 2022 01:02:13 EST Ventricular Rate:  67 PR Interval:  198 QRS Duration: 96 QT Interval:  464 QTC Calculation: 490 R Axis:   -1 Text Interpretation: Normal sinus rhythm Prolonged QT Abnormal ECG No previous ECGs available Confirmed by Landynn Dupler, Cyril Mourning [id number]) on 10/21/2022 1:08:55 AM       4:28 AM  Pt's QT interval still slightly prolonged after IV and oral potassium.  This may be his baseline as we have no old EKG for comparison.  He is awake.  Will ambulate and p.o. challenge.  Fianc at bedside to take him home.   4:35 AM  Pt ambulatory with steady gait, tolerating p.o.  Will discharge home with family.  No complaints at this time.   At this time, I do not feel there is any life-threatening condition present. I reviewed all nursing notes, vitals, pertinent previous records.  All lab and urine results, EKGs, imaging ordered have been independently reviewed and interpreted by myself.  I reviewed all available radiology reports from any imaging ordered this visit.  Based on my assessment, I feel the patient is safe to be discharged home without further emergent workup and can continue workup as an outpatient as needed. Discussed all findings, treatment plan as well as usual and customary return precautions.  They verbalize understanding and are comfortable with this plan.  Outpatient follow-up has been provided as needed.  All questions have been answered.    Miri Jose, Delice Bison, DO 10/21/22 [phone]

## 2022-10-20 NOTE — ED Notes (Signed)
3 L [place] placed on patient at this time

## 2022-10-20 NOTE — ED Notes (Signed)
Patient is resting comfortably. Family at bedside.  

## 2022-10-20 NOTE — ED Notes (Signed)
MD at bedside. 

## 2022-10-21 LAB — URINE DRUG SCREEN, QUALITATIVE (ARMC ONLY)
Amphetamines, Ur Screen: NOT DETECTED
Barbiturates, Ur Screen: NOT DETECTED
Benzodiazepine, Ur Scrn: POSITIVE — AB
Cannabinoid 50 Ng, Ur ~~LOC~~: NOT DETECTED
Cocaine Metabolite,Ur ~~LOC~~: NOT DETECTED
MDMA (Ecstasy)Ur Screen: NOT DETECTED
Methadone Scn, Ur: NOT DETECTED
Opiate, Ur Screen: NOT DETECTED
Phencyclidine (PCP) Ur S: NOT DETECTED
Tricyclic, Ur Screen: NOT DETECTED

## 2022-10-21 MED ORDER — POTASSIUM CHLORIDE CRYS ER 20 MEQ PO TBCR: 40.0000 meq | EXTENDED_RELEASE_TABLET | Freq: Every day | ORAL | 0 refills | Status: DC

## 2022-10-21 MED ORDER — POTASSIUM CHLORIDE 10 MEQ/100ML IV SOLN
10.0000 meq | INTRAVENOUS | Status: AC
  Administered 2022-10-21 (×2): 10 meq via INTRAVENOUS
  Filled 2022-10-21 (×2): qty 100

## 2022-10-21 NOTE — Discharge Instructions (Addendum)
Your potassium level today was low at 2.9.  I recommend that you take potassium for the next several days and follow-up with your primary care doctor in 1 to 2 weeks to have this rechecked.   Please go to the following website to schedule new (and existing) patient appointments:   [email or web address]   The following is a list of primary care offices in the area who are accepting new patients at this time.  Please reach out to one of them directly and let them know you would like to schedule an appointment to follow up on an Emergency Department visit, and/or to establish a new primary care provider (PCP).  There are likely other primary care clinics in the are who are accepting new patients, but this is an excellent place to start:  [place] physician: Dr Lavon Paganini [address] [place], [place] [address] [phone]  [facility] Lead Physician: Dr Steele Sizer [address], [place], [place] [address] [phone]  [facility] Physician: Dr Park Liter [address] [place], [place] [address] [phone]  [facility] Lead Physician: Dr Dewaine Oats [address] [place], [place] [address] [phone]  [facility] Physician: Dr Halina Maidens [address] [place], [place],  [address] [phone]

## 2022-10-21 NOTE — ED Notes (Signed)
Patient able to stand, ambulate and use the bathroom independently.

## 2022-10-21 NOTE — ED Notes (Signed)
Patient provided with urinal per request

## 2022-10-24 ENCOUNTER — Encounter: Payer: Self-pay | Admitting: Gastroenterology

## 2022-10-24 ENCOUNTER — Ambulatory Visit (AMBULATORY_SURGERY_CENTER): Payer: Managed Care, Other (non HMO) | Admitting: Gastroenterology

## 2022-10-24 VITALS — BP 97/65 | HR 104 | Temp 98.2°F | Resp 28 | Ht 66.0 in | Wt 214.0 lb

## 2022-10-24 DIAGNOSIS — D124 Benign neoplasm of descending colon: Secondary | ICD-10-CM | POA: Diagnosis not present

## 2022-10-24 DIAGNOSIS — D122 Benign neoplasm of ascending colon: Secondary | ICD-10-CM | POA: Diagnosis not present

## 2022-10-24 DIAGNOSIS — D123 Benign neoplasm of transverse colon: Secondary | ICD-10-CM | POA: Diagnosis not present

## 2022-10-24 DIAGNOSIS — Z1212 Encounter for screening for malignant neoplasm of rectum: Secondary | ICD-10-CM

## 2022-10-24 DIAGNOSIS — Z1211 Encounter for screening for malignant neoplasm of colon: Secondary | ICD-10-CM

## 2022-10-24 MED ORDER — SODIUM CHLORIDE 0.9 % IV SOLN: 500.0000 mL | Freq: Once | INTRAVENOUS | Status: DC

## 2022-10-24 NOTE — Op Note (Signed)
[facility] Patient Name: Scott Henry Procedure Date: 10/24/2022 3:12 PM MRN: [id number] Endoscopist: Nicki Reaper E. Candis Schatz , MD, [id number] Age: 52 Referring MD:  Date of Birth: 10-29-1970 Gender: Male Account #: [id number] Procedure:                Colonoscopy Indications:              Screening for colorectal malignant neoplasm, This                            is the patient's first colonoscopy Medicines:                Monitored Anesthesia Care Procedure:                Pre-Anesthesia Assessment:                           - Prior to the procedure, a History and Physical                            was performed, and patient medications and                            allergies were reviewed. The patient's tolerance of                            previous anesthesia was also reviewed. The risks                            and benefits of the procedure and the sedation                            options and risks were discussed with the patient.                            All questions were answered, and informed consent                            was obtained. Prior Anticoagulants: The patient has                            taken no anticoagulant or antiplatelet agents. ASA                            Grade Assessment: III - A patient with severe                            systemic disease. After reviewing the risks and                            benefits, the patient was deemed in satisfactory                            condition to undergo the procedure.  After obtaining informed consent, the colonoscope                            was passed under direct vision. Throughout the                            procedure, the patient's blood pressure, pulse, and                            oxygen saturations were monitored continuously. The                            CF [id number] #[id number] was introduced through the anus                            and advanced to the  the cecum, identified by                            appendiceal orifice and ileocecal valve. The                            colonoscopy was somewhat difficult due to                            significant looping. Successful completion of the                            procedure was aided by using manual pressure and                            withdrawing and reinserting the scope. The patient                            tolerated the procedure well. The quality of the                            bowel preparation was adequate. The ileocecal                            valve, appendiceal orifice, and rectum were                            photographed. The bowel preparation used was SUPREP                            via split dose instruction. Scope In: 3:28:26 PM Scope Out: 3:57:06 PM Scope Withdrawal Time: 0 hours 22 minutes 46 seconds  Total Procedure Duration: 0 hours 28 minutes 40 seconds  Findings:                 The perianal and digital rectal examinations were                            normal. Pertinent negatives include normal  sphincter tone and no palpable rectal lesions.                           Two sessile polyps were found in the ascending                            colon. The polyps were 7 to 8 mm in size. These                            polyps were removed with a cold snare. Resection                            and retrieval were complete. Estimated blood loss                            was minimal.                           Three sessile polyps were found in the transverse                            colon. The polyps were 5 to 7 mm in size. These                            polyps were removed with a cold snare. Resection                            and retrieval were complete. Estimated blood loss                            was minimal.                           A 5 mm polyp was found in the splenic flexure. The                            polyp  was sessile. The polyp was removed with a                            cold snare. Resection and retrieval were complete.                            Estimated blood loss was minimal.                           A 5 mm polyp was found in the descending colon. The                            polyp was sessile. The polyp was removed with a                            cold snare. Resection and retrieval were complete.  Estimated blood loss was minimal.                           Multiple medium-mouthed and small-mouthed                            diverticula were found in the descending colon,                            transverse colon and cecum. There was no evidence                            of diverticular bleeding.                           The exam was otherwise normal throughout the                            examined colon.                           The retroflexed view of the distal rectum and anal                            verge was normal and showed no anal or rectal                            abnormalities. Complications:            No immediate complications. Estimated Blood Loss:     Estimated blood loss was minimal. Impression:               - Two 7 to 8 mm polyps in the ascending colon,                            removed with a cold snare. Resected and retrieved.                           - Three 5 to 7 mm polyps in the transverse colon,                            removed with a cold snare. Resected and retrieved.                           - One 5 mm polyp at the splenic flexure, removed                            with a cold snare. Resected and retrieved.                           - One 5 mm polyp in the descending colon, removed                            with a cold snare. Resected and retrieved.                           -  Mild diverticulosis in the descending colon, in                            the transverse colon and in the cecum. There was no                             evidence of diverticular bleeding.                           - The distal rectum and anal verge are normal on                            retroflexion view. Recommendation:           - Patient has a contact number available for                            emergencies. The signs and symptoms of potential                            delayed complications were discussed with the                            patient. Return to normal activities tomorrow.                            Written discharge instructions were provided to the                            patient.                           - Resume previous diet.                           - Continue present medications.                           - Await pathology results.                           - Repeat colonoscopy (date not yet determined) for                            surveillance based on pathology results. Ayasha Ellingsen E. Candis Schatz, MD 10/24/2022 4:04:16 PM This report has been signed electronically.

## 2022-10-24 NOTE — Progress Notes (Signed)
Called to room to assist during endoscopic procedure.  Patient ID and intended procedure confirmed with present staff. Received instructions for my participation in the procedure from the performing physician.  

## 2022-10-24 NOTE — Progress Notes (Signed)
Report to pacu rn. Vss. Care resumed by rn. 

## 2022-10-24 NOTE — Progress Notes (Signed)
[facility] History and Physical   Primary Care Physician:  Elenore Paddy, MD   Reason for Procedure:   Colon cancer screening  Plan:    Screening colonoscopy     HPI: Scott Henry is a 52 y.o. male undergoing initial average risk screening colonoscopy.  He has no family history of colon cancer and no chronic GI symptoms.    Past Medical History:  Diagnosis Date   Diabetes mellitus without complication ([place])    Hypercholesteremia    Hypertension     Past Surgical History:  Procedure Laterality Date   BUNIONECTOMY Bilateral    NASAL SINUS SURGERY      Prior to Admission medications   Medication Sig Start Date End Date Taking? Authorizing Provider  atorvastatin (LIPITOR) 20 MG tablet Take 20 mg by mouth daily.   Yes [provider]  escitalopram (LEXAPRO) 10 MG tablet TAKE ONE AND A HALF TABLETS (15 MG DOSE) BY MOUTH DAILY. 07/22/21  Yes [provider]  lisinopril (PRINIVIL,ZESTRIL) 20 MG tablet Take 20 mg by mouth daily.   Yes [provider]  metFORMIN (GLUCOPHAGE) 1000 MG tablet Take 1,000 mg by mouth 2 (two) times daily with a meal.   Yes [provider]  potassium chloride SA (KLOR-CON M) 20 MEQ tablet Take 2 tablets (40 mEq total) by mouth daily. 10/21/22  Yes Ward, Kristen N, DO  fluticasone (FLONASE) 50 MCG/ACT nasal spray USE 1 SPRAY IN EACH NOSTRIL EVERY DAY AS DIRECTED 05/13/13   [provider]  ketoconazole (NIZORAL) 2 % cream APPLY TOPICALLY EVERY DAY AS NEEDED 07/16/21   [provider]  naproxen (NAPROSYN) 500 MG tablet Take 1 tablet (500 mg total) by mouth 2 (two) times daily with a meal. Patient not taking: Reported on 10/24/2022 06/27/18   Sable Feil, PA-C  Semaglutide, 1 MG/DOSE, (OZEMPIC, 1 MG/DOSE,) 4 MG/3ML [facility]  06/22/21   [provider]    Current Outpatient Medications  Medication Sig Dispense Refill   atorvastatin (LIPITOR) 20 MG tablet Take 20 mg by mouth daily.      escitalopram (LEXAPRO) 10 MG tablet TAKE ONE AND A HALF TABLETS (15 MG DOSE) BY MOUTH DAILY.     lisinopril (PRINIVIL,ZESTRIL) 20 MG tablet Take 20 mg by mouth daily.     metFORMIN (GLUCOPHAGE) 1000 MG tablet Take 1,000 mg by mouth 2 (two) times daily with a meal.     potassium chloride SA (KLOR-CON M) 20 MEQ tablet Take 2 tablets (40 mEq total) by mouth daily. 6 tablet 0   fluticasone (FLONASE) 50 MCG/ACT nasal spray USE 1 SPRAY IN EACH NOSTRIL EVERY DAY AS DIRECTED     ketoconazole (NIZORAL) 2 % cream APPLY TOPICALLY EVERY DAY AS NEEDED     naproxen (NAPROSYN) 500 MG tablet Take 1 tablet (500 mg total) by mouth 2 (two) times daily with a meal. (Patient not taking: Reported on 10/24/2022) 20 tablet 00   Semaglutide, 1 MG/DOSE, (OZEMPIC, 1 MG/DOSE,) 4 MG/3ML SOP[facility]      Current Facility-Administered Medications  Medication Dose Route Frequency Provider Last Rate Last Admin   0.9 %  sodium chloride infusion  500 mL Intravenous Once Daryel November, MD        Allergies as of 10/24/2022   (No Known Allergies)    Family History  Problem Relation Age of Onset   Lung cancer Mother    High blood pressure Mother    Prostate cancer Father    Heart disease Father  Colon cancer Neg Hx    Esophageal cancer Neg Hx    Rectal cancer Neg Hx    Stomach cancer Neg Hx     Social History   Socioeconomic History   Marital status: Single    Spouse name: Not on file   Number of children: Not on file   Years of education: Not on file   Highest education level: Not on file  Occupational History   Not on file  Tobacco Use   Smoking status: Never   Smokeless tobacco: Current    Types: Chew   Tobacco comments:    Used chew last on 10/23/22   Vaping Use   Vaping Use: Former  Substance and Sexual Activity   Alcohol use: Yes    Alcohol/week: 14.0 standard drinks of alcohol    Types: 14 Cans of beer per week   Drug use: Not Currently   Sexual activity: Not on file  Other Topics Concern   Not  on file  Social History Narrative   Not on file   Social Determinants of Health   Financial Resource Strain: Not on file  Food Insecurity: Not on file  Transportation Needs: Not on file  Physical Activity: Not on file  Stress: Not on file  Social Connections: Not on file  Intimate Partner Violence: Not on file    Review of Systems:  All other review of systems negative except as mentioned in the HPI.  Physical Exam: Vital signs BP (!) 109/96   Pulse 99   Temp 98.2 F (36.8 C)   Ht '5\' 6"'$  (1.676 m)   Wt 214 lb (97.1 kg)   SpO2 96%   BMI 34.54 kg/m   General:   Alert,  Well-developed, well-nourished, pleasant and cooperative in NAD Airway:  Mallampati 2 Lungs:  Clear throughout to auscultation.   Heart:  Regular rate and rhythm; no murmurs, clicks, rubs,  or gallops. Abdomen:  Soft, nontender and nondistended. Normal bowel sounds.   Neuro/Psych:  Normal mood and affect. A and O x 3   [name] E. Candis Schatz, MD [facility]

## 2022-10-24 NOTE — Patient Instructions (Addendum)
Recommendation: Patient has a contact number available for                            emergencies. The signs and symptoms of potential                            delayed complications were discussed with the                            patient. Return to normal activities tomorrow.                            Written discharge instructions were provided to the                            patient.                           - Resume previous diet.                           - Continue present medications.                           - Await pathology results.                           - Repeat colonoscopy (date not yet determined) for                            surveillance based on pathology   Handouts on diverticulosis and polyps given.   YOU HAD AN ENDOSCOPIC PROCEDURE TODAY AT [facility]:   Refer to the procedure report that was given to you for any specific questions about what was found during the examination.  If the procedure report does not answer your questions, please call your gastroenterologist to clarify.  If you requested that your care partner not be given the details of your procedure findings, then the procedure report has been included in a sealed envelope for you to review at your convenience later.  YOU SHOULD EXPECT: Some feelings of bloating in the abdomen. Passage of more gas than usual.  Walking can help get rid of the air that was put into your GI tract during the procedure and reduce the bloating. If you had a lower endoscopy (such as a colonoscopy or flexible sigmoidoscopy) you may notice spotting of blood in your stool or on the toilet paper. If you underwent a bowel prep for your procedure, you may not have a normal bowel movement for a few days.  Please Note:  You might notice some irritation and congestion in your nose or some drainage.  This is from the oxygen used during your procedure.  There is no need for concern and it should clear up in a day or  so.  SYMPTOMS TO REPORT IMMEDIATELY:  Following lower endoscopy (colonoscopy or flexible sigmoidoscopy):  Excessive amounts of blood in the stool  Significant tenderness or worsening of abdominal pains  Swelling of the abdomen that is new, acute  Fever of 100F or higher  For  urgent or emergent issues, a gastroenterologist can be reached at any hour by calling [phone]. Do not use MyChart messaging for urgent concerns.   DIET:  We do recommend a small meal at first, but then you may proceed to your regular diet.  Drink plenty of fluids but you should avoid alcoholic beverages for 24 hours.  ACTIVITY:  You should plan to take it easy for the rest of today and you should NOT DRIVE or use heavy machinery until tomorrow (because of the sedation medicines used during the test).    FOLLOW UP: Our staff will call the number listed on your records the next business day following your procedure.  We will call around 7:15- 8:00 am to check on you and address any questions or concerns that you may have regarding the information given to you following your procedure. If we do not reach you, we will leave a message.     If any biopsies were taken you will be contacted by phone or by letter within the next 1-3 weeks.  Please call us at [phone] if you have not heard about the biopsies in 3 weeks.   SIGNATURES/CONFIDENTIALITY: You and/or your care partner have signed paperwork which will be entered into your electronic medical record.  These signatures attest to the fact that that the information above on your After Visit Summary has been reviewed and is understood.  Full responsibility of the confidentiality of this discharge information lies with you and/or your care-partner.

## 2022-10-25 ENCOUNTER — Telehealth: Payer: Self-pay | Admitting: *Deleted

## 2022-10-25 NOTE — Telephone Encounter (Signed)
  Follow up Call-     10/24/2022    2:31 PM  Call back number  Post procedure Call Back phone  # [phone]  Permission to leave phone message Yes     Patient questions:  Message left to call if necessary.

## 2022-10-31 NOTE — Progress Notes (Signed)
Scott Henry,   All seven polyps that I removed during your recent procedure were completely benign but were proven to be "pre-cancerous" polyps that MAY have grown into cancers if they had not been removed.  Studies shows that at least 20% of women over age 52 and 30% of men over age 29 have pre-cancerous polyps.  Based on current nationally recognized surveillance guidelines, I recommend that you have a repeat colonoscopy in 3 years.   If you develop any new rectal bleeding, abdominal pain or significant bowel habit changes, please contact me before then.

## 2022-12-01 ENCOUNTER — Emergency Department (HOSPITAL_BASED_OUTPATIENT_CLINIC_OR_DEPARTMENT_OTHER)
Admission: EM | Admit: 2022-12-01 | Discharge: 2022-12-01 | Disposition: A | Discharge status: Home / Self Care | Payer: Managed Care, Other (non HMO) | Attending: Emergency Medicine | Admitting: Emergency Medicine

## 2022-12-01 ENCOUNTER — Encounter (HOSPITAL_BASED_OUTPATIENT_CLINIC_OR_DEPARTMENT_OTHER): Payer: Self-pay

## 2022-12-01 ENCOUNTER — Emergency Department (HOSPITAL_BASED_OUTPATIENT_CLINIC_OR_DEPARTMENT_OTHER): Payer: Managed Care, Other (non HMO)

## 2022-12-01 DIAGNOSIS — E119 Type 2 diabetes mellitus without complications: Secondary | ICD-10-CM | POA: Diagnosis not present

## 2022-12-01 DIAGNOSIS — Z7984 Long term (current) use of oral hypoglycemic drugs: Secondary | ICD-10-CM | POA: Insufficient documentation

## 2022-12-01 DIAGNOSIS — R42 Dizziness and giddiness: Secondary | ICD-10-CM | POA: Insufficient documentation

## 2022-12-01 DIAGNOSIS — Y908 Blood alcohol level of 240 mg/100 ml or more: Secondary | ICD-10-CM | POA: Insufficient documentation

## 2022-12-01 DIAGNOSIS — F10229 Alcohol dependence with intoxication, unspecified: Secondary | ICD-10-CM | POA: Insufficient documentation

## 2022-12-01 DIAGNOSIS — I10 Essential (primary) hypertension: Secondary | ICD-10-CM | POA: Insufficient documentation

## 2022-12-01 DIAGNOSIS — F10929 Alcohol use, unspecified with intoxication, unspecified: Secondary | ICD-10-CM

## 2022-12-01 DIAGNOSIS — Z79899 Other long term (current) drug therapy: Secondary | ICD-10-CM | POA: Diagnosis not present

## 2022-12-01 LAB — CBG MONITORING, ED: Glucose-Capillary: 149 mg/dL — ABNORMAL HIGH (ref 70–99)

## 2022-12-01 LAB — COMPREHENSIVE METABOLIC PANEL
ALT: 33 U/L (ref 0–44)
AST: 48 U/L — ABNORMAL HIGH (ref 15–41)
Albumin: 4.2 g/dL (ref 3.5–5.0)
Alkaline Phosphatase: 35 U/L — ABNORMAL LOW (ref 38–126)
Anion gap: 12 (ref 5–15)
BUN: 13 mg/dL (ref 6–20)
CO2: 26 mmol/L (ref 22–32)
Calcium: 8.9 mg/dL (ref 8.9–10.3)
Chloride: 102 mmol/L (ref 98–111)
Creatinine, Ser: 0.85 mg/dL (ref 0.61–1.24)
GFR, Estimated: >60 mL/min (ref >60)
Glucose, Bld: 148 mg/dL — ABNORMAL HIGH (ref 70–99)
Potassium: 4 mmol/L (ref 3.5–5.1)
Sodium: 140 mmol/L (ref 135–145)
Total Bilirubin: 0.4 mg/dL (ref 0.3–1.2)
Total Protein: 6.9 g/dL (ref 6.5–8.1)

## 2022-12-01 LAB — RAPID URINE DRUG SCREEN, HOSP PERFORMED
Amphetamines: NOT DETECTED
Barbiturates: NOT DETECTED
Benzodiazepines: POSITIVE — AB
Cocaine: NOT DETECTED
Opiates: NOT DETECTED
Tetrahydrocannabinol: NOT DETECTED

## 2022-12-01 LAB — ETHANOL: Alcohol, Ethyl (B): 260 mg/dL — ABNORMAL HIGH (ref <10)

## 2022-12-01 LAB — CBC
HCT: 41.4 % (ref 39.0–52.0)
Hemoglobin: 14.6 g/dL (ref 13.0–17.0)
MCH: 31.8 pg (ref 26.0–34.0)
MCHC: 35.3 g/dL (ref 30.0–36.0)
MCV: 90.2 fL (ref 80.0–100.0)
Platelets: 238 10*3/uL (ref 150–400)
RBC: 4.59 MIL/uL (ref 4.22–5.81)
RDW: 13.8 % (ref 11.5–15.5)
WBC: 3.5 10*3/uL — ABNORMAL LOW (ref 4.0–10.5)
nRBC: 0 % (ref 0.0–0.2)

## 2022-12-01 MED ORDER — CHLORDIAZEPOXIDE HCL 25 MG PO CAPS: ORAL_CAPSULE | ORAL | 0 refills | Status: DC

## 2022-12-01 MED ORDER — SODIUM CHLORIDE 0.9 % IV BOLUS
1000.0000 mL | Freq: Once | INTRAVENOUS | Status: AC
  Administered 2022-12-01: 1000 mL via INTRAVENOUS

## 2022-12-01 MED ORDER — IOHEXOL 350 MG/ML SOLN
80.0000 mL | Freq: Once | INTRAVENOUS | Status: AC | PRN
  Administered 2022-12-01: 80 mL via INTRAVENOUS

## 2022-12-01 NOTE — Discharge Instructions (Signed)
Please review the videos that are provided pertaining to alcohol use disorder.  Take Librium only if you stop using alcohol and start having alcohol withdrawal symptoms. Currently you are already on Ativan.  Mixing Ativan and alcohol is dangerous.  Mixing Librium and alcohol is dangerous.  Follow-up with your primary care doctor for additional resources.

## 2022-12-01 NOTE — ED Notes (Signed)
Patient transported to X-ray 

## 2022-12-01 NOTE — ED Triage Notes (Signed)
BIB EMS, was at airport getting ready to travel to [place], states was at the kiosk and started getting dizzy and having tunnel vision. Hx of vertigo, states dizziness has improved but still slightly there. EMS states stoke screen negative.

## 2022-12-01 NOTE — ED Provider Notes (Signed)
[place] [facility] Provider Note   CSN: [id number] Arrival date & time: 12/01/22  [id number]     History  Chief Complaint  Patient presents with   Dizziness    Scott Henry is a 52 y.o. male.  HPI     52 year old patient comes in with chief complaint of dizziness, near fainting. Patient has history of hypertension, hyperlipidemia and diabetes.  He states that he had [place] to the airport earlier today.  When he got to the kiosk, he started experiencing dizziness and having visual disturbance.  Patient describes dizziness as vertiginous symptoms.  He did not feel like he was going to faint.  He still feels slightly woozy.  Patient has had previous history of alcohol intoxication.  Initially he indicates that he did not have any alcohol use today.  Patient denies any double vision, loss of vision, slurred speech, difficulty in swallowing, one-sided weakness, numbness.  Home Medications Prior to Admission medications   Medication Sig Start Date End Date Taking? Authorizing Provider  chlordiazePOXIDE (LIBRIUM) 25 MG capsule '50mg'$  PO TID x 1D, then 25-'50mg'$  PO BID X 1D, then 25-'50mg'$  PO QD X 1D 12/01/22  Yes Meshelle Holness, MD  atorvastatin (LIPITOR) 20 MG tablet Take 20 mg by mouth daily.    [provider]  escitalopram (LEXAPRO) 10 MG tablet TAKE ONE AND A HALF TABLETS (15 MG DOSE) BY MOUTH DAILY. 07/22/21   [provider]  fluticasone (FLONASE) 50 MCG/ACT nasal spray USE 1 SPRAY IN EACH NOSTRIL EVERY DAY AS DIRECTED 05/13/13   [provider]  ketoconazole (NIZORAL) 2 % cream APPLY TOPICALLY EVERY DAY AS NEEDED 07/16/21   [provider]  lisinopril (PRINIVIL,ZESTRIL) 20 MG tablet Take 20 mg by mouth daily.    [provider]  metFORMIN (GLUCOPHAGE) 1000 MG tablet Take 1,000 mg by mouth 2 (two) times daily with a meal.    [provider]  naproxen (NAPROSYN) 500 MG tablet Take 1 tablet (500 mg total) by  mouth 2 (two) times daily with a meal. Patient not taking: Reported on 10/24/2022 06/27/18   Sable Feil, PA-C  potassium chloride SA (KLOR-CON M) 20 MEQ tablet Take 2 tablets (40 mEq total) by mouth daily. 10/21/22   Ward, Delice Bison, DO  Semaglutide, 1 MG/DOSE, (OZEMPIC, 1 MG/DOSE,) 4 MG/3ML SOPN  06/22/21   [provider]      Allergies    Patient has no known allergies.    Review of Systems   Review of Systems  All other systems reviewed and are negative.   Physical Exam Updated Vital Signs BP 111/78 (BP Location: Right Arm)   Pulse 87   Temp 98.9 F (37.2 C) (Oral)   Resp 18   Ht '5\' 6"'$  (1.676 m)   Wt 96.2 kg   SpO2 97%   BMI 34.22 kg/m  Physical Exam Vitals and nursing note reviewed.  Constitutional:      Appearance: He is well-developed.  HENT:     Head: Atraumatic.  Eyes:     Extraocular Movements: Extraocular movements intact.     Pupils: Pupils are equal, round, and reactive to light.     Comments: No nystagmus  Cardiovascular:     Rate and Rhythm: Normal rate.  Pulmonary:     Effort: Pulmonary effort is normal.  Musculoskeletal:     Cervical back: Neck supple.  Skin:    General: Skin is warm.  Neurological:     Mental Status:  He is alert and oriented to person, place, and time.     Cranial Nerves: No cranial nerve deficit.     Sensory: No sensory deficit.     Motor: No weakness.     Deep Tendon Reflexes: Reflexes normal.     ED Results / Procedures / Treatments   Labs (all labs ordered are listed, but only abnormal results are displayed) Labs Reviewed  RAPID URINE DRUG SCREEN, HOSP PERFORMED - Abnormal; Notable for the following components:      Result Value   Benzodiazepines POSITIVE (*)    All other components within normal limits  CBC - Abnormal; Notable for the following components:   WBC 3.5 (*)    All other components within normal limits  COMPREHENSIVE METABOLIC PANEL - Abnormal; Notable for the following components:    Glucose, Bld 148 (*)    AST 48 (*)    Alkaline Phosphatase 35 (*)    All other components within normal limits  ETHANOL - Abnormal; Notable for the following components:   Alcohol, Ethyl (B) 260 (*)    All other components within normal limits  CBG MONITORING, ED - Abnormal; Notable for the following components:   Glucose-Capillary 149 (*)    All other components within normal limits    EKG EKG Interpretation  Date/Time:  Thursday December 01 2022 10:04:35 EST Ventricular Rate:  82 PR Interval:  187 QRS Duration: 97 QT Interval:  432 QTC Calculation: 505 R Axis:   66 Text Interpretation: Sinus rhythm Borderline T wave abnormalities Prolonged QT interval No acute changes No significant change since last tracing Confirmed by Varney Biles [phone]) on 12/01/2022 10:12:47 AM  Radiology CT ANGIO HEAD NECK W WO CM  Result Date: 12/01/2022 CLINICAL DATA:  Transient ischemic attack (TIA) EXAM: CT ANGIOGRAPHY HEAD AND NECK TECHNIQUE: Multidetector CT imaging of the head and neck was performed using the standard protocol during bolus administration of intravenous contrast. Multiplanar CT image reconstructions and MIPs were obtained to evaluate the vascular anatomy. Carotid stenosis measurements (when applicable) are obtained utilizing NASCET criteria, using the distal internal carotid diameter as the denominator. RADIATION DOSE REDUCTION: This exam was performed according to the departmental dose-optimization program which includes automated exposure control, adjustment of the mA and/or kV according to patient size and/or use of iterative reconstruction technique. CONTRAST:  31m OMNIPAQUE IOHEXOL 350 MG/ML SOLN COMPARISON:  None Available. FINDINGS: CT HEAD FINDINGS Brain: No evidence of acute infarction, hemorrhage, hydrocephalus, extra-axial collection or mass lesion/mass effect. Vascular: See below. Skull: No acute fracture. Sinuses/Orbits: Clear sinuses. Prior endoscopic sinus surgery. No acute  orbital findings. Other: No mastoid effusions. Review of the MIP images confirms the above findings CTA NECK FINDINGS Aortic arch: Great vessel origins are patent without significant stenosis. Right carotid system: Atherosclerosis at the carotid bifurcation without greater than 50% stenosis. Left carotid system: Atherosclerosis at the carotid bifurcation without greater than 50% stenosis. Vertebral arteries: Left dominant. Both vertebral arteries are patent without greater than 50% stenosis. Skeleton: No acute findings on limited assessment. Other neck: No acute findings on limited assessment. Upper chest: Visualized lung apices are clear. Review of the MIP images confirms the above findings CTA HEAD FINDINGS Limited due to venous timing/contamination. Anterior circulation: Bilateral intracranial ICAs, MCAs, and ACAs are patent without proximal high-grade significant stenosis. Posterior circulation: Bilateral intradural vertebral arteries, basilar artery and bilateral posterior cerebral arteries are patent without proximal high-grade stenosis. Venous sinuses: No evidence of dural venous sinus thrombosis. Review of the MIP images  confirms the above findings IMPRESSION: No emergent large vessel occlusion or proximal high-grade stenosis. Electronically Signed   By: Margaretha Sheffield M.D.   On: 12/01/2022 12:25    Procedures Procedures    Medications Ordered in ED Medications  sodium chloride 0.9 % bolus 1,000 mL (0 mLs Intravenous Stopped 12/01/22 1139)  iohexol (OMNIPAQUE) 350 MG/ML injection 80 mL (80 mLs Intravenous Contrast Given 12/01/22 1145)    ED Course/ Medical Decision Making/ A&P                             Medical Decision Making Amount and/or Complexity of Data Reviewed Labs: ordered. Radiology: ordered.  Risk Prescription drug management.  This patient presents to the ED with chief complaint(s) of dizziness, vision disturbance with pertinent past medical history of hypertension,  hyperlipidemia, diabetes.The complaint involves an extensive differential diagnosis and also carries with it a high risk of complications and morbidity.    The differential diagnosis includes  TIA, acute stroke, orthostatic dizziness, anxiety disorder, dehydration, alcohol or toxin ingestion.  The initial plan is to get basic labs.  Patient's neuroexam is nonfocal at this time.  He does have stroke risk factors however.  Patient asked about his alcohol use, he denies any alcohol use today.   Additional history obtained: Records reviewed  outside hospital records.  Patient had a visit for alcohol intoxication in the past.  Independent labs interpretation:  The following labs were independently interpreted: Patient's ethanol level is 260. Labs otherwise are reassuring.  My suspicion is that most likely patient's symptoms are related to alcohol intoxication.  Independent visualization and interpretation of imaging: - I independently visualized the following imaging with scope of interpretation limited to determining acute life threatening conditions related to emergency care: CT scan of the brain, which revealed no evidence of brain bleed.  Radiologist did not notice any aneurysm, significant narrowing of the carotid or vertebral arteries.  Treatment and Reassessment: Patient reassessed.  He feels better.  His fiance is at the bedside.  He was comfortable with [place] sharing the results with him.  I discussed with him the ethanol level and my suspicion that patient's symptoms are related to his alcohol use disorder.  Patient admits to relatively heavy drinking last night.  He is talking to his primary care doctor about getting resources on alcohol use disorder.  He wants Librium prescription, advised him to not mix it with ethanol.    Final Clinical Impression(s) / ED Diagnoses Final diagnoses:  Alcoholic intoxication with complication ([place])    Rx / DC Orders ED Discharge Orders           Ordered    chlordiazePOXIDE (LIBRIUM) 25 MG capsule        12/01/22 1243              Varney Biles, MD 12/01/22 1250

## 2023-01-21 ENCOUNTER — Emergency Department
Admission: EM | Admit: 2023-01-21 | Discharge: 2023-01-21 | Disposition: A | Discharge status: Home / Self Care | Payer: Managed Care, Other (non HMO) | Attending: Emergency Medicine | Admitting: Emergency Medicine

## 2023-01-21 ENCOUNTER — Emergency Department: Payer: Managed Care, Other (non HMO)

## 2023-01-21 ENCOUNTER — Other Ambulatory Visit: Payer: Self-pay

## 2023-01-21 DIAGNOSIS — E876 Hypokalemia: Secondary | ICD-10-CM | POA: Insufficient documentation

## 2023-01-21 DIAGNOSIS — Y900 Blood alcohol level of less than 20 mg/100 ml: Secondary | ICD-10-CM | POA: Diagnosis not present

## 2023-01-21 DIAGNOSIS — J45909 Unspecified asthma, uncomplicated: Secondary | ICD-10-CM | POA: Insufficient documentation

## 2023-01-21 DIAGNOSIS — I10 Essential (primary) hypertension: Secondary | ICD-10-CM | POA: Diagnosis not present

## 2023-01-21 DIAGNOSIS — F10930 Alcohol use, unspecified with withdrawal, uncomplicated: Secondary | ICD-10-CM | POA: Diagnosis not present

## 2023-01-21 DIAGNOSIS — E119 Type 2 diabetes mellitus without complications: Secondary | ICD-10-CM | POA: Insufficient documentation

## 2023-01-21 DIAGNOSIS — R000 Tachycardia, unspecified: Secondary | ICD-10-CM | POA: Insufficient documentation

## 2023-01-21 HISTORY — DX: Unspecified asthma, uncomplicated: J45.909

## 2023-01-21 HISTORY — DX: Anxiety disorder, unspecified: F41.9

## 2023-01-21 LAB — LIPASE, BLOOD: Lipase: 64 U/L — ABNORMAL HIGH (ref 11–51)

## 2023-01-21 LAB — TROPONIN I (HIGH SENSITIVITY)
Troponin I (High Sensitivity): 13 ng/L (ref <18)
Troponin I (High Sensitivity): 13 ng/L (ref <18)

## 2023-01-21 LAB — CBC
HCT: 42.5 % (ref 39.0–52.0)
Hemoglobin: 15 g/dL (ref 13.0–17.0)
MCH: 32 pg (ref 26.0–34.0)
MCHC: 35.3 g/dL (ref 30.0–36.0)
MCV: 90.6 fL (ref 80.0–100.0)
Platelets: 144 10*3/uL — ABNORMAL LOW (ref 150–400)
RBC: 4.69 MIL/uL (ref 4.22–5.81)
RDW: 12.7 % (ref 11.5–15.5)
WBC: 4.6 10*3/uL (ref 4.0–10.5)
nRBC: 0 % (ref 0.0–0.2)

## 2023-01-21 LAB — HEPATIC FUNCTION PANEL
ALT: 71 U/L — ABNORMAL HIGH (ref 0–44)
AST: 77 U/L — ABNORMAL HIGH (ref 15–41)
Albumin: 3.7 g/dL (ref 3.5–5.0)
Alkaline Phosphatase: 47 U/L (ref 38–126)
Bilirubin, Direct: 0.3 mg/dL — ABNORMAL HIGH (ref 0.0–0.2)
Indirect Bilirubin: 1 mg/dL — ABNORMAL HIGH (ref 0.3–0.9)
Total Bilirubin: 1.3 mg/dL — ABNORMAL HIGH (ref 0.3–1.2)
Total Protein: 6.8 g/dL (ref 6.5–8.1)

## 2023-01-21 LAB — ETHANOL: Alcohol, Ethyl (B): <10 mg/dL (ref <10)

## 2023-01-21 LAB — BASIC METABOLIC PANEL
Anion gap: 12 (ref 5–15)
BUN: 9 mg/dL (ref 6–20)
CO2: 25 mmol/L (ref 22–32)
Calcium: 8.6 mg/dL — ABNORMAL LOW (ref 8.9–10.3)
Chloride: 95 mmol/L — ABNORMAL LOW (ref 98–111)
Creatinine, Ser: 0.74 mg/dL (ref 0.61–1.24)
GFR, Estimated: >60 mL/min (ref >60)
Glucose, Bld: 127 mg/dL — ABNORMAL HIGH (ref 70–99)
Potassium: 3.1 mmol/L — ABNORMAL LOW (ref 3.5–5.1)
Sodium: 132 mmol/L — ABNORMAL LOW (ref 135–145)

## 2023-01-21 MED ORDER — THIAMINE HCL 100 MG PO TABS: 100.0000 mg | ORAL_TABLET | Freq: Every day | ORAL | 0 refills | Status: AC

## 2023-01-21 MED ORDER — DIAZEPAM 5 MG/ML IJ SOLN
10.0000 mg | Freq: Once | INTRAMUSCULAR | Status: AC
  Administered 2023-01-21: 10 mg via INTRAVENOUS
  Filled 2023-01-21: qty 2

## 2023-01-21 MED ORDER — THIAMINE HCL 100 MG/ML IJ SOLN
100.0000 mg | Freq: Once | INTRAMUSCULAR | Status: AC
  Administered 2023-01-21: 100 mg via INTRAVENOUS
  Filled 2023-01-21: qty 2

## 2023-01-21 MED ORDER — LACTATED RINGERS IV BOLUS
1000.0000 mL | Freq: Once | INTRAVENOUS | Status: AC
  Administered 2023-01-21: 1000 mL via INTRAVENOUS

## 2023-01-21 MED ORDER — POTASSIUM CHLORIDE CRYS ER 20 MEQ PO TBCR
40.0000 meq | EXTENDED_RELEASE_TABLET | Freq: Once | ORAL | Status: AC
  Administered 2023-01-21: 40 meq via ORAL
  Filled 2023-01-21: qty 2

## 2023-01-21 MED ORDER — FOLIC ACID 1 MG PO TABS
1.0000 mg | ORAL_TABLET | Freq: Once | ORAL | Status: AC
  Administered 2023-01-21: 1 mg via ORAL
  Filled 2023-01-21: qty 1

## 2023-01-21 MED ORDER — CHLORDIAZEPOXIDE HCL 25 MG PO CAPS: ORAL_CAPSULE | ORAL | 0 refills | Status: AC

## 2023-01-21 MED ORDER — CHLORDIAZEPOXIDE HCL 25 MG PO CAPS
50.0000 mg | ORAL_CAPSULE | Freq: Once | ORAL | Status: AC
  Administered 2023-01-21: 50 mg via ORAL
  Filled 2023-01-21: qty 2

## 2023-01-21 NOTE — Discharge Instructions (Addendum)
Please take another 2 pills of the Librium tonight before you go to bed.  Tomorrow start taking the taper that I have prescribed you.  If you start to drink alcohol then stop taking the Librium.  If your tremors are getting worse or you start to have hallucinations then please return to the emergency department.

## 2023-01-21 NOTE — ED Provider Notes (Signed)
[facility] Provider Note    Event Date/Time   First MD Initiated Contact with Patient 01/21/23 1516     (approximate)   History   Nausea and Dizziness   HPI  Scott Henry is a 52 y.o. male  with pmh alcohol use disorder, diabetes, hypertension, hyperlipidemia who presents with alcohol withdrawal.  Patient has been trying to quit drinking.  Last drink was Thursday around 5 PM.  Started to feel unwell last night.  Endorses about 4 episodes of nonbloody nonbilious emesis shakiness when feeling dizzy and unsteady.  Denies headache vision change numbness tingling weakness.  Still able to ambulate.  Denies auditory visual hallucinations.  He has p.o. lorazepam and Librium.  Started the Librium yesterday and has the lorazepam as needed.  Typically drinks about a pint of liquor per day.  Says he has needed hospitalization for alcohol withdrawal in the past but no history of withdrawal seizures or DTs.  He denies chest pain or dyspnea.     Past Medical History:  Diagnosis Date   Alcohol dependence (HCC) 1999   1 pint liquor daily since about 25 years ago   Anxiety    Asthma    Diabetes mellitus without complication (HCC)    Hypercholesteremia    Hypertension     There are no problems to display for this patient.    Physical Exam  Triage Vital Signs: ED Triage Vitals  Enc Vitals Group     BP 01/21/23 1445 (!) 144/101     Pulse Rate 01/21/23 1445 (!) 104     Resp 01/21/23 1445 20     Temp 01/21/23 1445 98.8 F (37.1 C)     Temp Source 01/21/23 1445 Oral     SpO2 01/21/23 1445 100 %     Weight 01/21/23 1448 212 lb (96.2 kg)     Height 01/21/23 1448 5\' 6"  (1.676 m)     Head Circumference --      Peak Flow --      Pain Score 01/21/23 1448 0     Pain Loc --      Pain Edu? --      Excl. in GC? --     Most recent vital signs: Vitals:   01/21/23 1853 01/21/23 1900  BP:  (!) 159/120  Pulse:  83  Resp:  18  Temp: 98 F (36.7 C)   SpO2:  96%      General: Awake, no distress.  CV:  Good peripheral perfusion.  Resp:  Normal effort.  Abd:  No distention.  Neuro:             Awake, Alert, Oriented x 3  Other:  Aox3, nml speech  PERRL, EOMI, face symmetric, nml tongue movement  5/5 strength in the BL upper and lower extremities  Sensation grossly intact in the BL upper and lower extremities  Finger-nose-finger intact BL Patient has normal gait no ataxia  Mild tremulousness patient is mentating normally   ED Results / Procedures / Treatments  Labs (all labs ordered are listed, but only abnormal results are displayed) Labs Reviewed  BASIC METABOLIC PANEL - Abnormal; Notable for the following components:      Result Value   Sodium 132 (*)    Potassium 3.1 (*)    Chloride 95 (*)    Glucose, Bld 127 (*)    Calcium 8.6 (*)    All other components within normal limits  CBC - Abnormal; Notable for the following  components:   Platelets 144 (*)    All other components within normal limits  LIPASE, BLOOD - Abnormal; Notable for the following components:   Lipase 64 (*)    All other components within normal limits  HEPATIC FUNCTION PANEL - Abnormal; Notable for the following components:   AST 77 (*)    ALT 71 (*)    Total Bilirubin 1.3 (*)    Bilirubin, Direct 0.3 (*)    Indirect Bilirubin 1.0 (*)    All other components within normal limits  ETHANOL  TROPONIN I (HIGH SENSITIVITY)  TROPONIN I (HIGH SENSITIVITY)     EKG  EKG reviewed interpreted myself shows sinus tachycardia with right axis deviation no acute ischemic changes   RADIOLOGY I reviewed and interpreted the CXR which does not show any acute cardiopulmonary process    PROCEDURES:  Critical Care performed: No  .Critical Care  Performed by: Georga Hacking, MD Authorized by: Georga Hacking, MD   Critical care provider statement:    Critical care time (minutes):  30   Critical care was time spent personally by me on the following  activities:  Development of treatment plan with patient or surrogate, discussions with consultants, evaluation of patient's response to treatment, examination of patient, ordering and review of laboratory studies, ordering and review of radiographic studies, ordering and performing treatments and interventions, pulse oximetry, re-evaluation of patient's condition and review of old charts   The patient is on the cardiac monitor to evaluate for evidence of arrhythmia and/or significant heart rate changes.   MEDICATIONS ORDERED IN ED: Medications  chlordiazePOXIDE (LIBRIUM) capsule 50 mg (has no administration in time range)  lactated ringers bolus 1,000 mL (0 mLs Intravenous Stopped 01/21/23 1757)  diazepam (VALIUM) injection 10 mg (10 mg Intravenous Given 01/21/23 1609)  thiamine (VITAMIN B1) injection 100 mg (100 mg Intravenous Given 01/21/23 1607)  folic acid (FOLVITE) tablet 1 mg (1 mg Oral Given 01/21/23 1612)  diazepam (VALIUM) injection 10 mg (10 mg Intravenous Given 01/21/23 1849)  potassium chloride SA (KLOR-CON M) CR tablet 40 mEq (40 mEq Oral Given 01/21/23 1849)     IMPRESSION / MDM / ASSESSMENT AND PLAN / ED COURSE  I reviewed the triage vital signs and the nursing notes.                              Patient's presentation is most consistent with acute presentation with potential threat to life or bodily function.  Differential diagnosis includes, but is not limited to, alcohol withdrawal, DTs, electrode abnormality, AKI, pancreatitis, alcoholic hepatitis, low suspicion for posterior stroke   Patient is a 52 year old male with alcohol use disorder who presents because of alcohol withdrawal symptoms.  Last drink was at 5 PM on Thursday, 2 days ago.  He typically drinks about a pint per day.  He is endorsing tremulousness anxiety and vomiting.  No confusion or hallucinations.  Denies history of DTs or seizure.  Patient does feel somewhat dizzy and unsteady but denies vision change  numbness weakness or headache.  Patient is hypertensive mildly tachycardic on arrival.  On my exam he is mildly tremulous but he is able to ambulate with steady gait normal finger-nose and neurologic exam is nonfocal.  He is mentating normally.  I do think his symptoms are due to alcohol withdrawal.  Plan to give IV Valium thiamine folate and a bolus of fluid.  Will reassess.  Labs are notable  for mild transaminitis likely in the setting of alcohol use.  Patient has no significant abdominal pain.  Troponins are negative x 2.  CBC largely within normal limits.  Mild hypokalemia which was supplemented orally.  On reassessment after IV Valium patient says he is feeling improved.  Tremors have improved but he still has mild tremor.  Will give another dose of IV Valium but I suspect he will be able to be discharged.  On reassessment after second dose of IV Valium patient is resting comfortably.  Continues to mentate normally with no tachycardia just mild hypertension.  I do think patient is appropriate for discharge with Librium taper.  Of note he was already prescribed Librium to take 1 to 2 pills as needed.  I will give him a dose of 50 prior to discharge.  Recommend he take another 50 prior to going to bed.  I then prescribed him a taper to take tomorrow.  We discussed return precautions.  Patient appropriate for discharge at this time.      FINAL CLINICAL IMPRESSION(S) / ED DIAGNOSES   Final diagnoses:  Alcohol withdrawal syndrome without complication (HCC)     Rx / DC Orders   ED Discharge Orders          Ordered    chlordiazePOXIDE (LIBRIUM) 25 MG capsule  Multiple Frequencies        01/21/23 1920    thiamine (VITAMIN B1) 100 MG tablet  Daily        01/21/23 1920             Note:  This document was prepared using Dragon voice recognition software and may include unintentional dictation errors.   Georga Hacking, MD 01/21/23 Ernestina [place]

## 2023-01-21 NOTE — ED Notes (Signed)
Informed EDP Quale CIWA 15.

## 2023-01-21 NOTE — ED Triage Notes (Signed)
Pt to ED for NVD, dizziness, and diaphoresis since Thursday. Pt takes ativan 2mg  BID as needed and just started chlordiazepoxide 25mg  1-2 times per day on Thursday.  States feels like has been having vertigo and "stumbling around" for last few days.   Pt appears shaky. Asked if drinks alcohol, pt endorses 1 pint liquor daily. Last drink was Wednesday (3d ago). Denies hx severe ETOH withdrawel.   Endorses 3-4 episodes vomiting and diarrhea per day since Thursday.

## 2023-12-15 ENCOUNTER — Emergency Department
Admission: EM | Admit: 2023-12-15 | Discharge: 2023-12-16 | Disposition: A | Discharge status: Home / Self Care | Payer: Self-pay | Attending: Emergency Medicine | Admitting: Emergency Medicine

## 2023-12-15 ENCOUNTER — Other Ambulatory Visit: Payer: Self-pay

## 2023-12-15 DIAGNOSIS — F10230 Alcohol dependence with withdrawal, uncomplicated: Secondary | ICD-10-CM | POA: Insufficient documentation

## 2023-12-15 DIAGNOSIS — Y908 Blood alcohol level of 240 mg/100 ml or more: Secondary | ICD-10-CM | POA: Insufficient documentation

## 2023-12-15 DIAGNOSIS — F10920 Alcohol use, unspecified with intoxication, uncomplicated: Secondary | ICD-10-CM

## 2023-12-15 DIAGNOSIS — R4182 Altered mental status, unspecified: Secondary | ICD-10-CM | POA: Insufficient documentation

## 2023-12-15 LAB — COMPREHENSIVE METABOLIC PANEL
ALT: 89 U/L — ABNORMAL HIGH (ref 0–44)
AST: 122 U/L — ABNORMAL HIGH (ref 15–41)
Albumin: 3.8 g/dL (ref 3.5–5.0)
Alkaline Phosphatase: 52 U/L (ref 38–126)
Anion gap: 15 (ref 5–15)
BUN: 11 mg/dL (ref 6–20)
CO2: 23 mmol/L (ref 22–32)
Calcium: 8.5 mg/dL — ABNORMAL LOW (ref 8.9–10.3)
Chloride: 103 mmol/L (ref 98–111)
Creatinine, Ser: 0.77 mg/dL (ref 0.61–1.24)
GFR, Estimated: >60 mL/min (ref >60)
Glucose, Bld: 136 mg/dL — ABNORMAL HIGH (ref 70–99)
Potassium: 4 mmol/L (ref 3.5–5.1)
Sodium: 141 mmol/L (ref 135–145)
Total Bilirubin: 0.6 mg/dL (ref 0.0–1.2)
Total Protein: 7.4 g/dL (ref 6.5–8.1)

## 2023-12-15 LAB — CBC
HCT: 43.2 % (ref 39.0–52.0)
Hemoglobin: 15.1 g/dL (ref 13.0–17.0)
MCH: 31.7 pg (ref 26.0–34.0)
MCHC: 35 g/dL (ref 30.0–36.0)
MCV: 90.8 fL (ref 80.0–100.0)
Platelets: 188 10*3/uL (ref 150–400)
RBC: 4.76 MIL/uL (ref 4.22–5.81)
RDW: 13.9 % (ref 11.5–15.5)
WBC: 4 10*3/uL (ref 4.0–10.5)
nRBC: 0 % (ref 0.0–0.2)

## 2023-12-15 LAB — SALICYLATE LEVEL: Salicylate Lvl: <7 mg/dL — ABNORMAL LOW (ref 7.0–30.0)

## 2023-12-15 LAB — ACETAMINOPHEN LEVEL: Acetaminophen (Tylenol), Serum: <10 ug/mL — ABNORMAL LOW (ref 10–30)

## 2023-12-15 LAB — ETHANOL: Alcohol, Ethyl (B): 379 mg/dL (ref <10)

## 2023-12-15 MED ORDER — LORAZEPAM 2 MG PO TABS: 0.0000 mg | ORAL_TABLET | Freq: Two times a day (BID) | ORAL | Status: DC

## 2023-12-15 MED ORDER — ONDANSETRON HCL 4 MG/2ML IJ SOLN
4.0000 mg | Freq: Once | INTRAMUSCULAR | Status: AC
  Administered 2023-12-15: 4 mg via INTRAVENOUS
  Filled 2023-12-15: qty 2

## 2023-12-15 MED ORDER — THIAMINE HCL 100 MG/ML IJ SOLN: 100.0000 mg | Freq: Every day | INTRAMUSCULAR | Status: DC

## 2023-12-15 MED ORDER — THIAMINE MONONITRATE 100 MG PO TABS: 100.0000 mg | ORAL_TABLET | Freq: Every day | ORAL | Status: DC

## 2023-12-15 MED ORDER — LORAZEPAM 2 MG/ML IJ SOLN
1.0000 mg | Freq: Once | INTRAMUSCULAR | Status: AC
  Administered 2023-12-15: 1 mg via INTRAVENOUS
  Filled 2023-12-15: qty 1

## 2023-12-15 MED ORDER — LORAZEPAM 2 MG PO TABS
0.0000 mg | ORAL_TABLET | Freq: Four times a day (QID) | ORAL | Status: DC
  Administered 2023-12-15: 2 mg via ORAL
  Filled 2023-12-15: qty 1

## 2023-12-15 MED ORDER — LORAZEPAM 2 MG/ML IJ SOLN: 0.0000 mg | Freq: Two times a day (BID) | INTRAMUSCULAR | Status: DC

## 2023-12-15 MED ORDER — LORAZEPAM 2 MG/ML IJ SOLN: 0.0000 mg | Freq: Four times a day (QID) | INTRAMUSCULAR | Status: DC

## 2023-12-15 NOTE — ED Provider Notes (Signed)
 [facility] Provider Note    Event Date/Time   First MD Initiated Contact with Patient 12/15/23 1533     (approximate)   History   No chief complaint on file.   HPI  Scott Henry is a 53 year old male presenting to the emergency department for evaluation of altered mental status.  Patient was on the phone with family when they heard snoring.  Patient was responding so they were concerned about possible syncopal episode.  On my evaluation, patient tells me that he drink too much alcohol.  Reports he drinks about a liter of liquor a day.  More recently thinks he may have been drinking 1 to 2 L.  EMS found multiple empty bottles in the area.  Patient tells me he is interested in detox.  Denies history of complicated withdrawal.  Denies other complaints.    Physical Exam   Triage Vital Signs: ED Triage Vitals  Encounter Vitals Group     BP 12/15/23 1825 (!) 160/98     Systolic BP Percentile --      Diastolic BP Percentile --      Pulse Rate 12/15/23 1825 90     Resp 12/15/23 1825 18     Temp 12/15/23 1825 98 F (36.7 C)     Temp Source 12/15/23 1825 Oral     SpO2 12/15/23 1538 95 %     Weight --      Height --      Head Circumference --      Peak Flow --      Pain Score 12/15/23 1825 0     Pain Loc --      Pain Education --      Exclude from Growth Chart --     Most recent vital signs: Vitals:   12/15/23 2025 12/15/23 2134  BP:    Pulse: (!) 125 (!) 107  Resp:  20  Temp:    SpO2: 90%      General: Awake, interactive  CV:  Regular rate, good peripheral perfusion.  Resp:  Unlabored respirations, lungs good auscultation Abd:  Nondistended, soft, nontender to palpation Neuro:  Symmetric facial movement, fluid speech, 5-5 strength in the bilateral upper and lower extremities, initial mild ataxia with finger-to-nose testing, resolved on reevaluation   ED Results / Procedures / Treatments   Labs (all labs ordered are listed, but only  abnormal results are displayed) Labs Reviewed  COMPREHENSIVE METABOLIC PANEL - Abnormal; Notable for the following components:      Result Value   Glucose, Bld 136 (*)    Calcium 8.5 (*)    AST 122 (*)    ALT 89 (*)    All other components within normal limits  ETHANOL - Abnormal; Notable for the following components:   Alcohol, Ethyl (B) 379 (*)    All other components within normal limits  ACETAMINOPHEN LEVEL - Abnormal; Notable for the following components:   Acetaminophen (Tylenol), Serum <10 (*)    All other components within normal limits  SALICYLATE LEVEL - Abnormal; Notable for the following components:   Salicylate Lvl <7.0 (*)    All other components within normal limits  CBC  URINE DRUG SCREEN, QUALITATIVE ([facility] ONLY)     EKG EKG independently reviewed interpreted by myself (ER attending) demonstrates:  EKG demonstrate sinus tachycardia at a rate of 112, PR 184, QRS 86, QTc 472, no acute ST changes  RADIOLOGY Imaging independently reviewed and interpreted by myself demonstrates:  PROCEDURES:  Critical Care performed: No  Procedures   MEDICATIONS ORDERED IN ED: Medications  LORazepam (ATIVAN) injection 1 mg (1 mg Intravenous Given 12/15/23 2034)  ondansetron (ZOFRAN) injection 4 mg (4 mg Intravenous Given 12/15/23 2034)     IMPRESSION / MDM / ASSESSMENT AND PLAN / ED COURSE  I reviewed the triage vital signs and the nursing notes.  Differential diagnosis includes, but is not limited to, alcohol intoxication, lower suspicion acute intracranial bleed given rapid improvement in mental status and ultimate normal neurologic exam, consideration for anemia, electrolyte abnormality  Patient's presentation is most consistent with acute presentation with potential threat to life or bodily function.  53 year old male presenting with altered mental status, possible syncopal episode.  Stable vitals on presentation.  Labs notable for significantly elevated EtOH at 379.   Suspect this is the etiology of patient's presentation.  He was serially observed with improvement in his mental status.  Did began to develop some nausea and tachycardia, possibly related to early withdrawal for which she was given a dose of Zofran and Ativan.  He is comfortable with discharge home.  He is given outpatient detox resources.  Discussed the importance of not stopping drinking suddenly.  Patient discharged stable condition.      FINAL CLINICAL IMPRESSION(S) / ED DIAGNOSES   Final diagnoses:  Alcoholic intoxication without complication (HCC)     Rx / DC Orders   ED Discharge Orders     None        Note:  This document was prepared using Dragon voice recognition software and may include unintentional dictation errors.   Trinna Post, MD 12/15/23 2212

## 2023-12-15 NOTE — ED Notes (Signed)
 Pt and I made several attempts to obtain a ride back home d/t d/c but no rides available. Dr. Rosalia Hammers was notified. Pt will remain in ED#24H until he finds a ride home.

## 2023-12-15 NOTE — ED Notes (Signed)
 VOL detox

## 2023-12-15 NOTE — ED Notes (Signed)
 Pt ask for something to help him sleep, states "all the drama" in the hallway is making it hard for him to sleep. RN notified.

## 2023-12-15 NOTE — ED Triage Notes (Signed)
 Pt arrived via [facility] from home. EMS was called out by family that is out of state because pt was not responding like normal to them. Pt family reported that they were on the phone with the pt and the pt went silent and family heard snoring and after yelling the pt's name for a few minutes that pt responded to family and family believes pt had a syncopal episode. Pt reports having had 1L of ETOH today, and per EMS there were 6L of empty ETOH bottles in the vicinity of where the pt was found. Family reports that the pt has spiraled like this previously.  Pt has a Hx of DM and HTN. Pt denies having any of his regular meds x3 days, and no food x 2 days. Pt c/o nausea and dizziness.

## 2023-12-15 NOTE — ED Notes (Signed)
 Discontinue psych sheet as pt is not a psych pt nor under IVC.

## 2023-12-15 NOTE — Discharge Instructions (Signed)
 Your testing today fortunately did not show an emergency cause for your symptoms.  I have included resources for outpatient and inpatient treatment of your alcohol abuse.  Return to the ER for new or worsening symptoms.

## 2023-12-15 NOTE — ED Notes (Signed)
 While asleep, patient oxygen saturation 88%. Patient placed on 2L. MD Ray aware at this time

## 2023-12-16 ENCOUNTER — Emergency Department: Payer: Self-pay

## 2023-12-16 ENCOUNTER — Emergency Department
Admission: EM | Admit: 2023-12-16 | Discharge: 2023-12-16 | Disposition: A | Discharge status: Home / Self Care | Payer: Self-pay | Attending: Emergency Medicine | Admitting: Emergency Medicine

## 2023-12-16 DIAGNOSIS — F10120 Alcohol abuse with intoxication, uncomplicated: Secondary | ICD-10-CM | POA: Diagnosis present

## 2023-12-16 DIAGNOSIS — F10920 Alcohol use, unspecified with intoxication, uncomplicated: Secondary | ICD-10-CM

## 2023-12-16 DIAGNOSIS — Y908 Blood alcohol level of 240 mg/100 ml or more: Secondary | ICD-10-CM | POA: Insufficient documentation

## 2023-12-16 LAB — URINE DRUG SCREEN, QUALITATIVE (ARMC ONLY)
Amphetamines, Ur Screen: NOT DETECTED
Barbiturates, Ur Screen: NOT DETECTED
Benzodiazepine, Ur Scrn: NOT DETECTED
Cannabinoid 50 Ng, Ur ~~LOC~~: POSITIVE — AB
Cocaine Metabolite,Ur ~~LOC~~: NOT DETECTED
MDMA (Ecstasy)Ur Screen: NOT DETECTED
Methadone Scn, Ur: NOT DETECTED
Opiate, Ur Screen: NOT DETECTED
Phencyclidine (PCP) Ur S: NOT DETECTED
Tricyclic, Ur Screen: NOT DETECTED

## 2023-12-16 LAB — ETHANOL: Alcohol, Ethyl (B): 358 mg/dL (ref <10)

## 2023-12-16 MED ORDER — ONDANSETRON 4 MG PO TBDP
4.0000 mg | ORAL_TABLET | Freq: Once | ORAL | Status: AC
Start: 2023-12-16 — End: 2023-12-16
  Administered 2023-12-16: 4 mg via ORAL
  Filled 2023-12-16: qty 1

## 2023-12-16 NOTE — Discharge Instructions (Signed)
You were seen in the emergency department for alcohol intoxication.  Please seek help from the recommended resources for assistance with your alcohol dependence.  If you have any thoughts of hurting herself or others, please call 911 or return to the emergency department.  Please avoid drug and alcohol use.  Never drive a vehicle or operate machinery while intoxicated.  

## 2023-12-16 NOTE — ED Notes (Signed)
 Walked pt girlfriend to [facility], she asked me how could she IVC the pt, I advised her she would have to contact the magistrate. Girlfriend advised me the pt told her he left AMA on his previous earlier today.

## 2023-12-16 NOTE — ED Notes (Signed)
Voluntary 

## 2023-12-16 NOTE — ED Triage Notes (Signed)
 Per ems pt family member called EMS thisd am d/t etoh. Pt sts he drunk less than a pint of liquor. Pt was d/c approx 3 hours ago for ETOH and given resources for alcohol abuse. Pt answers all questions appropriate.

## 2023-12-16 NOTE — ED Notes (Signed)
 Pt is talking with Dr. York Cerise at the bedside. Pt is alert and oriented x4.

## 2023-12-16 NOTE — ED Provider Notes (Signed)
 [facility] Provider Note    Event Date/Time   First MD Initiated Contact with Patient 12/16/23 [phone]     (approximate)   History   Alcohol Intoxication   HPI Scott Henry is a 53 y.o. male who was just discharged from this facility about 3 hours ago for alcohol intoxication and presents by EMS for alcohol intoxication.  The time frame is unclear but it seems likely that after he was discharged he went home and drank about a pint of Christiane Ha.  He was asleep but apparently his girlfriend or another family member called EMS to have him brought back.  He has no complaints or concerns.  He has not been vomiting and he is in no distress.  He has not had an altercation.  He is resting but awakens easily.  When he was discharged several hours ago he was given outpatient resources to follow-up for his alcohol dependence.     Physical Exam   Triage Vital Signs: ED Triage Vitals  Encounter Vitals Group     BP 12/16/23 0402 117/77     Systolic BP Percentile --      Diastolic BP Percentile --      Pulse Rate 12/16/23 0402 (!) 102     Resp 12/16/23 0402 20     Temp 12/16/23 0402 98.4 F (36.9 C)     Temp src --      SpO2 12/16/23 0402 94 %     Weight --      Height --      Head Circumference --      Peak Flow --      Pain Score 12/16/23 0403 0     Pain Loc --      Pain Education --      Exclude from Growth Chart --     Most recent vital signs: Vitals:   12/16/23 0402 12/16/23 0622  BP: 117/77   Pulse: (!) 102 88  Resp: 20 20  Temp: 98.4 F (36.9 C)   SpO2: 94% 98%    General: Sleepy but awakens easily, malodorous and somewhat disheveled but generally appropriate under the circumstances. CV:  Good peripheral perfusion.  Borderline tachycardia. Resp:  Normal effort. Speaking easily and comfortably, no accessory muscle usage nor intercostal retractions.   Abd:  No distention.  No tenderness to palpation of the abdomen. Other:  Denies SI and HI,  appears somewhat intoxicated but is in no distress and denies any acute complaints or concerns including SI and HI.   ED Results / Procedures / Treatments   Labs (all labs ordered are listed, but only abnormal results are displayed) Labs Reviewed  ETHANOL - Abnormal; Notable for the following components:      Result Value   Alcohol, Ethyl (B) 358 (*)    All other components within normal limits     PROCEDURES:  Critical Care performed: No  Procedures    IMPRESSION / MDM / ASSESSMENT AND PLAN / ED COURSE  I reviewed the triage vital signs and the nursing notes.                              Differential diagnosis includes, but is not limited to, alcohol intoxication, psychiatric disorder.  Patient's presentation is most consistent with exacerbation of chronic illness.  Labs/studies ordered: Ethanol level  Interventions/Medications given:  Medications - No data to display  (Note:  hospital course  my include additional interventions and/or labs/studies not listed above.)   Patient had a full medical evaluation just hours ago and was just discharged a few hours ago.  Is unclear to me if he went home and drank some more but it seems likely.  No signs of alcohol withdrawal.  The patient is not in any distress currently and he is protecting his airway with no current concerns for aspiration.  Is unclear to me why EMS was called, possibly family unhappiness over his continued drinking.  We will monitor him to make sure he is safe and I will check an ethanol level to see if it is up higher than it was previously.  However there is no indication that he meets criteria for inpatient treatment and he will most likely need to proceed with outpatient follow-up.     Clinical Course as of 12/16/23 1610  Sat Dec 16, 2023  0503 Girlfriend called with collateral information that the patient has been "stumbling" and has fallen several times.  I think this is mostly due to the alcohol  intoxication, but I will order a CT head. [CF]  0557 CT Head Wo Contrast I viewed and interpreted the patient's CT head I see no evidence of intracranial bleeding or neoplasm.  I also read the radiologist's report, which confirmed no acute findings. [CF]  9604 Patient is awake and alert and seems clinically sober despite his high alcohol level.  His girlfriend is at bedside.  I talked with both of them about his addiction and his alcohol dependence and need for outpatient follow-up.  They both understand that there is no indication for hospitalization at this time and he claims he will cut back on his alcohol use and follow-up on the resources given to him.  The patient's medical screening exam is reassuring with no indication of an emergent medical condition requiring hospitalization or additional evaluation at this point.  The patient is safe and appropriate for discharge and outpatient follow up. [CF]    Clinical Course User Index [CF] Loleta Rose, MD     FINAL CLINICAL IMPRESSION(S) / ED DIAGNOSES   Final diagnoses:  Alcoholic intoxication without complication (HCC)     Rx / DC Orders   ED Discharge Orders     None        Note:  This document was prepared using Dragon voice recognition software and may include unintentional dictation errors.   Loleta Rose, MD 12/16/23 [phone]

## 2023-12-16 NOTE — ED Notes (Signed)
 Pt was assisted to the hallway bathroom via w/c by this narrator. Pt was unstable on feet. Pt was then assisted back to bed.

## 2024-09-12 NOTE — Progress Notes (Signed)
 Sent message, via epic in basket, requesting orders in epic from Careers adviser.

## 2024-09-13 NOTE — Progress Notes (Signed)
 COVID Vaccine received:  []  No [x]  Yes Date of any COVID positive Test in last 90 days:  PCP - Ozell Gal, MD Lauraine Glatter, FNP  [phone]  Cardiologist -  none  Chest x-ray - 01-21-2023  2v Epic EKG -  12-18-2023  EPIC Stress Test -  ECHO -  Cardiac Cath -  CT Coronary Calcium score:   Pacemaker / ICD device [x]  No []  Yes   Spinal Cord Stimulator:[x]  No []  Yes       History of Sleep Apnea? []  No [x]  Yes  mild OSA CPAP used?- [x]  No []  Yes    Medication on DOS: Propranolol, Buspar, lexapro?   Albuterol inhaler, Flonase nasal spray   Hold IND:Opdpwnempo,   Patient has: []  NO Hx DM   [x]  Pre-DM   []  DM1  []   DM2 Does the patient monitor blood sugar?   []  N/A   []  No []  Yes  Last A1c was: 6.0 on 08-05-2024     Does patient have a [employer] or Dexcom? []  No []  Yes   Fasting Blood Sugar Ranges-  Checks Blood Sugar _____ times a day  Blood Thinner / Instructions:  none Aspirin Instructions:  none  Activity level: Able to walk up 2 flights of stairs without becoming significantly short of breath or having chest pain?   []    Yes   []  No,  would have:  Patient can perform ADLs without assistance.  []   Yes    []  No   Comments:   Anesthesia review: asthma, anxiety, HTN, Pre-DM, current ETOH abuse, GERD,   Patient denies any S&S of respiratory illness or Covid - no shortness of breath, fever, cough or chest pain at PAT appointment.  Patient verbalized understanding and agreement to the Pre-Surgical Instructions that were given to them at this PAT appointment. Patient was also educated of the need to review these PAT instructions again prior to his surgery.I reviewed the appropriate phone numbers to call if they have any and questions or concerns.

## 2024-09-13 NOTE — Patient Instructions (Signed)
 SURGICAL WAITING ROOM VISITATION Patients having surgery or a procedure may have no more than 2 support people in the waiting area - these visitors may rotate in the visitor waiting room.   If the patient needs to stay at the hospital during part of their recovery, the visitor guidelines for inpatient rooms apply.  PRE-OP VISITATION  Pre-op nurse will coordinate an appropriate time for 1 support person to accompany the patient in pre-op.  This support person may not rotate.  This visitor will be contacted when the time is appropriate for the visitor to come back in the pre-op area.  Please refer to the [facility] website for the visitor guidelines for Inpatients (after your surgery is over and you are in a regular room).  Temporary Visitor Restrictions  Children ages 48 and under will not be able to visit patients in [facility] under most circumstances. Visitation is not restricted outside of hospitals unless noted otherwise in the [facility] and Location Specific Visitation Guidelines at :      [email or web address] Visitors with respiratory illnesses are discouraged from visiting and should remain at home.  You are not required to quarantine at this time prior to your surgery. However, you must do this: Hand Hygiene often Do NOT share personal items Notify your provider if you are in close contact with someone who has COVID or you develop fever 100.4 or greater, new onset of sneezing, cough, sore throat, shortness of breath or body aches.  If you test positive for Covid or have been in contact with anyone that has tested positive in the last 10 days please notify you surgeon.    Your procedure is scheduled on:  Friday 09-27-2024  Report to [facility] Main Entrance: [address] entrance where the [employer] is available.   Report to admitting at: 05:15    AM  Call this number if you have any questions or problems the morning of surgery [phone]  Do not eat food  after Midnight the night prior to your surgery/procedure.  After Midnight you may have the following liquids until   04:30 AM DAY OF SURGERY  Clear Liquid Diet Water Black Coffee (sugar ok, NO MILK/CREAM OR CREAMERS)  Tea (sugar ok, NO MILK/CREAM OR CREAMERS) regular and decaf                             Plain Jell-O  with no fruit (NO RED)                                           Fruit ices (not with fruit pulp, NO RED)                                     Popsicles (NO RED)                                                                  Juice: NO CITRUS JUICES: only apple, WHITE grape, WHITE cranberry Sports drinks like Gatorade or Powerade (NO RED)  The day of surgery:  Drink ONE (1) Pre-Surgery G2 at 4:30 AM the morning of surgery. Drink in one sitting. Do not sip.  This drink was given to you during your hospital pre-op appointment visit. Nothing else to drink after completing the Pre-Surgery G2 : No candy, chewing gum or throat lozenges.    FOLLOW ANY ADDITIONAL PRE OP INSTRUCTIONS YOU RECEIVED FROM YOUR SURGEON'S OFFICE!!!   Oral Hygiene is also important to reduce your risk of infection.        Remember - BRUSH YOUR TEETH THE MORNING OF SURGERY WITH YOUR REGULAR TOOTHPASTE  Do NOT smoke after Midnight the night before surgery.  STOP TAKING all Vitamins, Herbs and supplements 1 week before your surgery.   Take ONLY these medicines the morning of surgery with A SIP OF WATER: Propranolol, Buspar, lexapro?   Albuterol inhaler, Flonase nasal spray   Hold IND:Opdpwnempo, ??   You may not have any metal on your body including  jewelry, and body piercing  Do not wear  lotions, powders,  cologne, or deodorant  Men may shave face and neck.  Contacts, Hearing Aids, dentures or bridgework may not be worn into surgery. DENTURES WILL BE REMOVED PRIOR TO SURGERY PLEASE DO NOT APPLY Poly grip OR ADHESIVES!!!  You may bring a small overnight bag with you on the  day of surgery, only pack items that are not valuable. [place] IS NOT RESPONSIBLE   FOR VALUABLES THAT ARE LOST OR STOLEN.   Do not bring your home medications to the hospital. The Pharmacy will dispense medications listed on your medication list to you during your admission in the Hospital.  Special Instructions: Bring a copy of your healthcare power of attorney and living will documents the day of surgery, if you wish to have them scanned into your [facility] Records- EPIC  Please read over the following fact sheets you were given: IF YOU HAVE QUESTIONS ABOUT YOUR PRE-OP INSTRUCTIONS, PLEASE CALL [phone].      Pre-operative 4 CHG Bath Instructions   You can play a key role in reducing the risk of infection after surgery. Your skin needs to be as free of germs as possible. You can reduce the number of germs on your skin by washing with CHG (chlorhexidine gluconate) soap before surgery. CHG is an antiseptic soap that kills germs and continues to kill germs even after washing.   DO NOT use if you have an allergy to chlorhexidine/CHG or antibacterial soaps. If your skin becomes reddened or irritated, stop using the CHG and notify one of our RNs at [phone]  Please shower with the CHG soap starting 4 days before surgery using the following schedule:   Monday  09-23-2024     Do NOT use CHG soap                                                                                                                      the morning of your  surgery.         Please keep in mind the following:  DO NOT shave, including legs and underarms, starting the day of your first shower.   You may shave your face at any point before/day of surgery.  Place clean sheets on your bed the day you start using CHG soap. Use a clean washcloth (not used since being washed) for  each shower. DO NOT sleep with pets once you start using the CHG.  CHG Shower Instructions:  If you choose to wash your hair and private area, wash first with your normal shampoo/soap.  After you use shampoo/soap, rinse your hair and body thoroughly to remove shampoo/soap residue.  Turn the water OFF and apply about 3 tablespoons (45 ml) of CHG soap to a CLEAN washcloth.  Apply CHG soap ONLY FROM YOUR NECK DOWN TO YOUR TOES (washing for 3-5 minutes)  DO NOT use CHG soap on face, private areas, open wounds, or sores.  Pay special attention to the area where your surgery is being performed.  If you are having back surgery, having someone wash your back for you may be helpful. Wait 2 minutes after CHG soap is applied, then you may rinse off the CHG soap.  Pat dry with a clean towel  Put on clean clothes/pajamas   If you choose to wear lotion, please use ONLY the CHG-compatible lotions on the back of this paper.     Additional instructions for the day of surgery: DO NOT APPLY any CHG Soap,  lotions, deodorants, cologne, or perfumes on the day of surgery  Put on clean/comfortable clothes.  Brush your teeth.  Ask your nurse before applying any prescription medications to the skin.   CHG Compatible Lotions   Aveeno Moisturizing lotion  Cetaphil Moisturizing Cream  Cetaphil Moisturizing Lotion  Clairol Herbal Essence Moisturizing Lotion, Dry Skin  Clairol Herbal Essence Moisturizing Lotion, Extra Dry Skin  Clairol Herbal Essence Moisturizing Lotion, Normal Skin  Curel Age Defying Therapeutic Moisturizing Lotion with Alpha Hydroxy  Curel Extreme Care Body Lotion  Curel Soothing Hands Moisturizing Hand Lotion  Curel Therapeutic Moisturizing Cream, Fragrance-Free  Curel Therapeutic Moisturizing Lotion, Fragrance-Free  Curel Therapeutic Moisturizing Lotion, Original Formula  Eucerin Daily Replenishing Lotion  Eucerin Dry Skin Therapy Plus Alpha Hydroxy Crme  Eucerin Dry Skin Therapy Plus  Alpha Hydroxy Lotion  Eucerin Original Crme  Eucerin Original Lotion  Eucerin Plus Crme Eucerin Plus Lotion  Eucerin TriLipid Replenishing Lotion  Keri Anti-Bacterial Hand Lotion  Keri Deep Conditioning Original Lotion Dry Skin Formula Softly Scented  Keri Deep Conditioning Original Lotion, Fragrance Free Sensitive Skin Formula  Keri Lotion Fast Absorbing Fragrance Free Sensitive Skin Formula  Keri Lotion Fast Absorbing Softly Scented Dry Skin Formula  Keri Original Lotion  Keri Skin Renewal Lotion Keri Silky Smooth Lotion  Keri Silky Smooth Sensitive Skin Lotion  Nivea Body Creamy Conditioning Oil  Nivea Body Extra Enriched Lotion  Nivea Body Original Lotion  Nivea Body Sheer Moisturizing Lotion Nivea Crme  Nivea Skin Firming Lotion  NutraDerm 30 Skin Lotion  NutraDerm Skin Lotion  NutraDerm Therapeutic Skin Cream  NutraDerm Therapeutic Skin Lotion  ProShield Protective Hand Cream  Provon moisturizing lotion   FAILURE TO FOLLOW THESE INSTRUCTIONS MAY RESULT IN THE CANCELLATION OF YOUR SURGERY  PATIENT SIGNATURE_________________________________  NURSE SIGNATURE__________________________________  ________________________________________________________________________        Scott Henry    An incentive spirometer is a tool that can help keep your lungs clear and active. This tool measures  how well you are filling your lungs with each breath. Taking long deep breaths may help reverse or decrease the chance of developing breathing (pulmonary) problems (especially infection) following: A long period of time when you are unable to move or be active. BEFORE THE PROCEDURE  If the spirometer includes an indicator to show your best effort, your nurse or respiratory therapist will set it to a desired goal. If possible, sit up straight or lean slightly forward. Try not to slouch. Hold the incentive spirometer in an upright position. INSTRUCTIONS FOR USE  Sit on the  edge of your bed if possible, or sit up as far as you can in bed or on a chair. Hold the incentive spirometer in an upright position. Breathe out normally. Place the mouthpiece in your mouth and seal your lips tightly around it. Breathe in slowly and as deeply as possible, raising the piston or the ball toward the top of the column. Hold your breath for 3-5 seconds or for as long as possible. Allow the piston or ball to fall to the bottom of the column. Remove the mouthpiece from your mouth and breathe out normally. Rest for a few seconds and repeat Steps 1 through 7 at least 10 times every 1-2 hours when you are awake. Take your time and take a few normal breaths between deep breaths. The spirometer may include an indicator to show your best effort. Use the indicator as a goal to work toward during each repetition. After each set of 10 deep breaths, practice coughing to be sure your lungs are clear. If you have an incision (the cut made at the time of surgery), support your incision when coughing by placing a pillow or rolled up towels firmly against it. Once you are able to get out of bed, walk around indoors and cough well. You may stop using the incentive spirometer when instructed by your caregiver.  RISKS AND COMPLICATIONS Take your time so you do not get dizzy or light-headed. If you are in pain, you may need to take or ask for pain medication before doing incentive spirometry. It is harder to take a deep breath if you are having pain. AFTER USE Rest and breathe slowly and easily. It can be helpful to keep track of a log of your progress. Your caregiver can provide you with a simple table to help with this. If you are using the spirometer at home, follow these instructions: SEEK MEDICAL CARE IF:  You are having difficultly using the spirometer. You have trouble using the spirometer as often as instructed. Your pain medication is not giving enough relief while using the spirometer. You  develop fever of 100.5 F (38.1 C) or higher.                                                                                                    SEEK IMMEDIATE MEDICAL CARE IF:  You cough up bloody sputum that had not been present before. You develop fever of 102 F (38.9 C) or greater. You develop worsening pain at or near the incision site.  MAKE SURE YOU:  Understand these instructions. Will watch your condition. Will get help right away if you are not doing well or get worse. Document Released: 01/23/2007 Document Revised: 12/05/2011 Document Reviewed: 03/26/2007 [facility] Patient Information 2014 [place], [place].        WHAT IS A BLOOD TRANSFUSION? Blood Transfusion Information  A transfusion is the replacement of blood or some of its parts. Blood is made up of multiple cells which provide different functions. Red blood cells carry oxygen and are used for blood loss replacement. White blood cells fight against infection. Platelets control bleeding. Plasma helps clot blood. Other blood products are available for specialized needs, such as hemophilia or other clotting disorders. BEFORE THE TRANSFUSION  Who gives blood for transfusions?  Healthy volunteers who are fully evaluated to make sure their blood is safe. This is blood bank blood. Transfusion therapy is the safest it has ever been in the practice of medicine. Before blood is taken from a donor, a complete history is taken to make sure that person has no history of diseases nor engages in risky social behavior (examples are intravenous drug use or sexual activity with multiple partners). The donor's travel history is screened to minimize risk of transmitting infections, such as malaria. The donated blood is tested for signs of infectious diseases, such as HIV and hepatitis. The blood is then tested to be sure it is compatible with you in order to minimize the chance of a transfusion reaction. If you or a relative donates blood,  this is often done in anticipation of surgery and is not appropriate for emergency situations. It takes many days to process the donated blood. RISKS AND COMPLICATIONS Although transfusion therapy is very safe and saves many lives, the main dangers of transfusion include:  Getting an infectious disease. Developing a transfusion reaction. This is an allergic reaction to something in the blood you were given. Every precaution is taken to prevent this. The decision to have a blood transfusion has been considered carefully by your caregiver before blood is given. Blood is not given unless the benefits outweigh the risks. AFTER THE TRANSFUSION Right after receiving a blood transfusion, you will usually feel much better and more energetic. This is especially true if your red blood cells have gotten low (anemic). The transfusion raises the level of the red blood cells which carry oxygen, and this usually causes an energy increase. The nurse administering the transfusion will monitor you carefully for complications. HOME CARE INSTRUCTIONS  No special instructions are needed after a transfusion. You may find your energy is better. Speak with your caregiver about any limitations on activity for underlying diseases you may have. SEEK MEDICAL CARE IF:  Your condition is not improving after your transfusion. You develop redness or irritation at the intravenous (IV) site. SEEK IMMEDIATE MEDICAL CARE IF:  Any of the following symptoms occur over the next 12 hours: Shaking chills. You have a temperature by mouth above 102 F (38.9 C), not controlled by medicine. Chest, back, or muscle pain. People around you feel you are not acting correctly or are confused. Shortness of breath or difficulty breathing. Dizziness and fainting. You get a rash or develop hives. You have a decrease in urine output. Your urine turns a dark color or changes to pink, red, or brown. Any of the following symptoms occur over the  next 10 days: You have a temperature by mouth above 102 F (38.9 C), not controlled by medicine. Shortness of breath. Weakness after normal  activity. The white part of the eye turns yellow (jaundice). You have a decrease in the amount of urine or are urinating less often. Your urine turns a dark color or changes to pink, red, or brown. Document Released: 09/09/2000 Document Revised: 12/05/2011 Document Reviewed: 04/28/2008 [facility] Patient Information 2014 [employer], [place].  _______________________________________________________________________       If you would like to see a video about joint replacement:   [email or web address]

## 2024-09-16 ENCOUNTER — Encounter (HOSPITAL_COMMUNITY): Payer: Self-pay

## 2024-09-16 ENCOUNTER — Encounter (HOSPITAL_COMMUNITY)
Admission: RE | Admit: 2024-09-16 | Discharge: 2024-09-16 | Disposition: A | Discharge status: Home / Self Care | Source: Ambulatory Visit

## 2024-09-16 ENCOUNTER — Other Ambulatory Visit: Payer: Self-pay

## 2024-09-16 VITALS — BP 138/80 | HR 74 | Temp 98.6°F | Resp 16 | Ht 66.0 in | Wt 210.0 lb

## 2024-09-16 DIAGNOSIS — M1612 Unilateral primary osteoarthritis, left hip: Secondary | ICD-10-CM | POA: Diagnosis not present

## 2024-09-16 DIAGNOSIS — Z01812 Encounter for preprocedural laboratory examination: Secondary | ICD-10-CM | POA: Diagnosis present

## 2024-09-16 DIAGNOSIS — I10 Essential (primary) hypertension: Secondary | ICD-10-CM | POA: Insufficient documentation

## 2024-09-16 DIAGNOSIS — F1090 Alcohol use, unspecified, uncomplicated: Secondary | ICD-10-CM | POA: Insufficient documentation

## 2024-09-16 DIAGNOSIS — R7303 Prediabetes: Secondary | ICD-10-CM | POA: Diagnosis not present

## 2024-09-16 HISTORY — DX: Pneumonia, unspecified organism: J18.9

## 2024-09-16 HISTORY — DX: Unspecified osteoarthritis, unspecified site: M19.90

## 2024-09-16 HISTORY — DX: Prediabetes: R73.03

## 2024-09-16 LAB — COMPREHENSIVE METABOLIC PANEL WITH GFR
ALT: 18 U/L (ref 0–44)
AST: 21 U/L (ref 15–41)
Albumin: 4.5 g/dL (ref 3.5–5.0)
Alkaline Phosphatase: 65 U/L (ref 38–126)
Anion gap: 12 (ref 5–15)
BUN: 11 mg/dL (ref 6–20)
CO2: 25 mmol/L (ref 22–32)
Calcium: 9.8 mg/dL (ref 8.9–10.3)
Chloride: 103 mmol/L (ref 98–111)
Creatinine, Ser: 0.73 mg/dL (ref 0.61–1.24)
GFR, Estimated: >60 mL/min
Glucose, Bld: 128 mg/dL — ABNORMAL HIGH (ref 70–99)
Potassium: 4.3 mmol/L (ref 3.5–5.1)
Sodium: 139 mmol/L (ref 135–145)
Total Bilirubin: 0.5 mg/dL (ref 0.0–1.2)
Total Protein: 7.3 g/dL (ref 6.5–8.1)

## 2024-09-16 LAB — TYPE AND SCREEN
ABO/RH(D): A POS
Antibody Screen: NEGATIVE

## 2024-09-16 LAB — SURGICAL PCR SCREEN
MRSA, PCR: NEGATIVE
Staphylococcus aureus: POSITIVE — AB

## 2024-09-16 LAB — CBC
HCT: 45.6 % (ref 39.0–52.0)
Hemoglobin: 15.4 g/dL (ref 13.0–17.0)
MCH: 30.4 pg (ref 26.0–34.0)
MCHC: 33.8 g/dL (ref 30.0–36.0)
MCV: 90.1 fL (ref 80.0–100.0)
Platelets: 284 K/uL (ref 150–400)
RBC: 5.06 MIL/uL (ref 4.22–5.81)
RDW: 13.9 % (ref 11.5–15.5)
WBC: 6.9 K/uL (ref 4.0–10.5)
nRBC: 0 % (ref 0.0–0.2)

## 2024-09-16 NOTE — Progress Notes (Signed)
 Patient's PCR screen is positive for STAPH. Appropriate notes have been placed on the patient's chart. This note has been routed to [employer] for review. The Patient's surgery is currently scheduled for:09-27-24 at [facility].  Shawnee Aloe, BSN, CVRN-BC   Pre-Surgical Testing Nurse [facility]  [phone]

## 2024-09-25 NOTE — Anesthesia Preprocedure Evaluation (Signed)
 "                                  Anesthesia Evaluation  Patient identified by MRN, date of birth, ID band Patient awake    Reviewed: Allergy & Precautions, NPO status , Patient's Chart, lab work & pertinent test results  Airway Mallampati: I  TM Distance: >3 FB Neck ROM: Full    Dental no notable dental hx. (+) Teeth Intact, Dental Advisory Given   Pulmonary asthma    Pulmonary exam normal breath sounds clear to auscultation       Cardiovascular hypertension, Pt. on medications (-) angina (-) Past MI Normal cardiovascular exam Rhythm:Regular Rate:Normal     Neuro/Psych  PSYCHIATRIC DISORDERS Anxiety     negative neurological ROS     GI/Hepatic Neg liver ROS,neg GERD  ,,  Endo/Other  diabetes, Well Controlled, Type 2  Semiglutide Lab Results      Component                Value               Date                      WBC                      6.9                 09/16/2024                HGB                      15.4                09/16/2024                HCT                      45.6                09/16/2024                MCV                      90.1                09/16/2024                PLT                      284                 09/16/2024             Renal/GU      Musculoskeletal  (+) Arthritis ,    Abdominal   Peds  Hematology Lab Results      Component                Value               Date                      WBC                      6.9  09/16/2024                HGB                      15.4                09/16/2024                HCT                      45.6                09/16/2024                MCV                      90.1                09/16/2024                PLT                      284                 09/16/2024              Anesthesia Other Findings   Reproductive/Obstetrics                              Anesthesia Physical Anesthesia Plan  ASA: 2  Anesthesia Plan:  Spinal   Post-op Pain Management: Ofirmev  IV (intra-op)* and Regional block*   Induction: Intravenous  PONV Risk Score and Plan: Treatment may vary due to age or medical condition, Ondansetron , Dexamethasone, Midazolam and Propofol infusion  Airway Management Planned: Nasal Cannula and Natural Airway  Additional Equipment: None  Intra-op Plan:   Post-operative Plan:   Informed Consent:      Dental advisory given  Plan Discussed with: CRNA and Surgeon  Anesthesia Plan Comments:          Anesthesia Quick Evaluation  "

## 2024-09-26 ENCOUNTER — Encounter (HOSPITAL_COMMUNITY): Payer: Self-pay

## 2024-09-26 NOTE — H&P (Signed)
 "  ORTHOPAEDIC HISTORY AND PHYSICAL  REQUESTING PHYSICIAN: Teresa Scott ORN, MD  PCP:  System, Provider Not In  Chief Complaint: Left femoral head osteonecrosis with pain  HPI: Scott Henry is a 54 y.o. 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  The patient was seen and evaluated by me in my office and was indicated to undergo anterior left total hip arthroplasty due to the failure of extensive non operative treatments to adequately control his symptoms.  These modalities included rest, non weight bearing, anti inflammatory medications, a medrol dose pak and home exercise program without improvement in his symptoms and now with evidence of subchondral collapse of the left femoral head.  The patient had a dental crown replaced earlier this week, but has otherwise had no significant changes to medical or surgical history. He has a past history of alcohol dependence, but states that he denies any significant alcohol use now. He has been NPO since midnight and wishes to proceed with left total hip arthroplasty today.  Past Medical History:  Diagnosis Date   Alcohol dependence (HCC) 1999   1 pint liquor daily since about 25 years ago   Anxiety    Arthritis    Asthma    Hypercholesteremia    Hypertension    Pneumonia    Pre-diabetes    Past Surgical History:  Procedure Laterality Date   BUNIONECTOMY Bilateral    MASTECTOMY Bilateral 2019   for Gynecomastia   NASAL SINUS SURGERY  2012   WISDOM TOOTH EXTRACTION     Social History   Socioeconomic History   Marital status: Single    Spouse name: Not on file   Number of children: Not on file   Years of education: Not on file   Highest education level: Not on file  Occupational History   Not on file  Tobacco Use   Smoking status: Never   Smokeless tobacco: Current    Types: Chew   Tobacco comments:    Used chew last on 10/23/22   Vaping Use   Vaping status: Former   Substance and Sexual Activity   Alcohol use: Not Currently    Comment: 1 pint liquor since 25 years ago and stopped 04-2024 per pat   Drug use: Not Currently   Sexual activity: Yes  Other Topics Concern   Not on file  Social History Narrative   Not on file   Social Drivers of Health   Tobacco Use: High Risk (09/26/2024)   Patient History    Smoking Tobacco Use: Never    Smokeless Tobacco Use: Current    Passive Exposure: Not on file  Financial Resource Strain: Not on file  Food Insecurity: Not on file  Transportation Needs: Not on file  Physical Activity: Not on file  Stress: Not on file  Social Connections: Not on file  Depression (EYV7-0): Not on file  Alcohol Screen: Not on file  Housing: Not on file  Utilities: Not on file  Health Literacy: Not on file   Family History  Problem Relation Age of Onset   Lung cancer Mother    High blood pressure Mother    Prostate cancer Father    Heart disease Father    Colon cancer Neg Hx    Esophageal cancer Neg Hx    Rectal cancer Neg Hx    Stomach cancer Neg Hx    Allergies[1] Prior to Admission medications  Medication Sig Start Date End Date Taking? Authorizing  Provider  albuterol (VENTOLIN HFA) 108 (90 Base) MCG/ACT inhaler Inhale 2 puffs into the lungs every 4 (four) hours as needed for shortness of breath or wheezing.   Yes [provider]  atorvastatin (LIPITOR) 20 MG tablet Take 20 mg by mouth daily.   Yes [provider]  busPIRone (BUSPAR) 15 MG tablet Take 15 mg by mouth daily at 12 noon. 09/06/23  Yes [provider]  cholecalciferol (VITAMIN D3) 25 MCG (1000 UNIT) tablet Take 1,000 Units by mouth daily.   Yes [provider]  fluticasone (FLONASE) 50 MCG/ACT nasal spray Place 1 spray into both nostrils daily as needed for rhinitis or allergies. 05/13/13  Yes [provider]  lisinopril (ZESTRIL) 40 MG tablet Take 40 mg by mouth daily.   Yes [provider]  metFORMIN  (GLUCOPHAGE) 500 MG tablet Take 500 mg by mouth 2 (two) times daily with a meal.   Yes [provider]  mirtazapine (REMERON) 30 MG tablet Take 30 mg by mouth at bedtime.   Yes [provider]  propranolol (INDERAL) 20 MG tablet Take 20 mg by mouth 2 (two) times daily as needed (anxiety).   Yes [provider]  Semaglutide, 1 MG/DOSE, (OZEMPIC, 1 MG/DOSE,) 4 MG/3ML SOPN Inject 1 mg into the skin once a week. 06/22/21  Yes [provider]  testosterone cypionate (DEPO-TESTOSTERONE) 200 MG/ML injection Inject 200 mg into the muscle every 14 (fourteen) days.   Yes [provider]   No results found.  Positive ROS: All other systems have been reviewed and were otherwise negative with the exception of those mentioned in the HPI and as above.  Physical Exam: General: Alert, no acute distress Cardiovascular: No pedal edema Respiratory: No cyanosis, no use of accessory musculature GI: No organomegaly, abdomen is soft and non-tender Skin: No lesions in the area of chief complaint Neurologic: Sensation intact distally Psychiatric: Patient is competent for consent with normal mood and affect Lymphatic: No axillary or cervical lymphadenopathy  MUSCULOSKELETAL: Left lower extremity - Intact skin. - Tenderness to the anterior hip. - Positive stinchfield. negative log roll - Antalgic gait present - Hip ROM: 120 degrees flexion, 5 degrees internal rotation with pain, 50 degrees external rotation. - Hip Strength: 5/5 hip flexion, adduction, abduction. - Positive FADIR. - Sensation intact to light touch  LFCN, sural, saphenous, tibial, deep and superficial peroneal nerve distributions - no significant leg length discrepancy - 2+ DP pulse.  Imaging:  MR arthrogram of the left hip previously reviewed and obtained at an outside institution with exam dated 06/28/2024 demonstrates a large area of osteonecrosis involving 1/3 of the femoral head and signal  extending into the femoral neck. The predominant area of involvement is in the weight bearing zone of the femoral head and there is mild collapse present. Thinning is noted of the articular cartilage and a anterosuperior labral tear is noted.  X-rays, three views of the bilateral hips, weight-bearing, were previously reviewed and obtained 09/05/2024 demonstrate evidence of cortical irregularity concerning for osteonecrosis of the left femoral head with collapse that has progressed significantly since his MRI in October. The right hip appears well without significant evidence of arthritis or AVN, though mild joint space narrowing is present.   Assessment: Left femoral head osteonecrosis with collapse of the femoral head History of alcohol use disorder History of diabetes mellitus (last A1C 6.0 08/05/2024)   54 year old male with left femoral head osteonecrosis with collapse that has failed non operative management. The  patient is indicated to undergo left anterior total hip arthroplasty due to the collapse of his femoral head and failure of non operative treatments to control his symptoms.  Risks, benefits and alternatives were again reviewed with the patient.  Risks discussed include, but are not limited to infection, bleeding, damage to structures such as nerves, arteries, and veins, numbness in the lateral femoral cutaneous nerve distribution, blood clots, stiffness, persistent disability, dislocation, periprosthetic infection or fracture, and risks associated with anesthesia including loss of life. The patient expressed understanding and wishes to proceed with left anterior total hip arthroplasty.  Plan: Informed consent obtained Proceed with left anterior total hip arthroplasty All appropriate clearances and pre operative testing completed NPO 2 g ancef for abx ppx 1 g TXA to minimize hematoma formation Plan for asa 81 mg BID x 6 weeks for DVT ppx post op Tylenol  and oxycodone  scripts  provided today for pain Patient to take stool softener while on narcotic pain medications PT post op for mobilization Return to clinic 2 weeks post op for skin check    Scott LELON Pizza, MD    09/26/2024 11:03 PM     [1] No Known Allergies  "

## 2024-09-27 ENCOUNTER — Other Ambulatory Visit (HOSPITAL_COMMUNITY): Payer: Self-pay

## 2024-09-27 ENCOUNTER — Ambulatory Visit (HOSPITAL_COMMUNITY): Admitting: Anesthesiology

## 2024-09-27 ENCOUNTER — Ambulatory Visit (HOSPITAL_COMMUNITY)
Admission: RE | Admit: 2024-09-27 | Discharge: 2024-09-27 | Disposition: A | Discharge status: Home / Self Care | Source: Ambulatory Visit

## 2024-09-27 ENCOUNTER — Ambulatory Visit (HOSPITAL_COMMUNITY)

## 2024-09-27 ENCOUNTER — Encounter (HOSPITAL_COMMUNITY): Payer: Self-pay

## 2024-09-27 ENCOUNTER — Encounter (HOSPITAL_COMMUNITY): Admission: RE | Disposition: A | Discharge status: Home / Self Care | Payer: Self-pay | Source: Ambulatory Visit

## 2024-09-27 DIAGNOSIS — E119 Type 2 diabetes mellitus without complications: Secondary | ICD-10-CM | POA: Insufficient documentation

## 2024-09-27 DIAGNOSIS — I10 Essential (primary) hypertension: Secondary | ICD-10-CM | POA: Insufficient documentation

## 2024-09-27 DIAGNOSIS — M87052 Idiopathic aseptic necrosis of left femur: Secondary | ICD-10-CM | POA: Diagnosis not present

## 2024-09-27 DIAGNOSIS — M879 Osteonecrosis, unspecified: Secondary | ICD-10-CM | POA: Diagnosis present

## 2024-09-27 DIAGNOSIS — F419 Anxiety disorder, unspecified: Secondary | ICD-10-CM | POA: Insufficient documentation

## 2024-09-27 DIAGNOSIS — J45909 Unspecified asthma, uncomplicated: Secondary | ICD-10-CM | POA: Insufficient documentation

## 2024-09-27 DIAGNOSIS — Z8249 Family history of ischemic heart disease and other diseases of the circulatory system: Secondary | ICD-10-CM | POA: Diagnosis not present

## 2024-09-27 DIAGNOSIS — Z7984 Long term (current) use of oral hypoglycemic drugs: Secondary | ICD-10-CM | POA: Diagnosis not present

## 2024-09-27 DIAGNOSIS — M1990 Unspecified osteoarthritis, unspecified site: Secondary | ICD-10-CM | POA: Diagnosis not present

## 2024-09-27 DIAGNOSIS — Z7985 Long-term (current) use of injectable non-insulin antidiabetic drugs: Secondary | ICD-10-CM | POA: Diagnosis not present

## 2024-09-27 DIAGNOSIS — Z79899 Other long term (current) drug therapy: Secondary | ICD-10-CM | POA: Insufficient documentation

## 2024-09-27 HISTORY — PX: TOTAL HIP ARTHROPLASTY: SHX124

## 2024-09-27 LAB — ABO/RH: ABO/RH(D): A POS

## 2024-09-27 LAB — GLUCOSE, CAPILLARY
Glucose-Capillary: 97 mg/dL (ref 70–99)
Glucose-Capillary: 140 mg/dL — ABNORMAL HIGH (ref 70–99)

## 2024-09-27 SURGERY — ARTHROPLASTY, HIP, TOTAL, ANTERIOR APPROACH
Anesthesia: Spinal | Site: Hip | Laterality: Left

## 2024-09-27 MED ORDER — ALBUMIN HUMAN 5 % IV SOLN
INTRAVENOUS | Status: AC
  Filled 2024-09-27: qty 250

## 2024-09-27 MED ORDER — ONDANSETRON HCL 4 MG/2ML IJ SOLN: 4.0000 mg | Freq: Once | INTRAMUSCULAR | Status: DC | PRN

## 2024-09-27 MED ORDER — PROPOFOL 10 MG/ML IV BOLUS
INTRAVENOUS | Status: DC | PRN
  Administered 2024-09-27: 100 ug/kg/min via INTRAVENOUS
  Administered 2024-09-27: 80 mg via INTRAVENOUS

## 2024-09-27 MED ORDER — PROPOFOL 500 MG/50ML IV EMUL
INTRAVENOUS | Status: AC
  Filled 2024-09-27: qty 50

## 2024-09-27 MED ORDER — BUPIVACAINE-EPINEPHRINE (PF) 0.25% -1:200000 IJ SOLN
INTRAMUSCULAR | Status: AC
  Filled 2024-09-27: qty 30

## 2024-09-27 MED ORDER — LACTATED RINGERS IV BOLUS
500.0000 mL | Freq: Once | INTRAVENOUS | Status: AC
  Administered 2024-09-27: 500 mL via INTRAVENOUS

## 2024-09-27 MED ORDER — PROPOFOL 10 MG/ML IV BOLUS
INTRAVENOUS | Status: AC
  Filled 2024-09-27: qty 20

## 2024-09-27 MED ORDER — ACETAMINOPHEN 500 MG PO TABS
1000.0000 mg | ORAL_TABLET | Freq: Three times a day (TID) | ORAL | 0 refills | Status: AC
  Filled 2024-09-27: qty 120, 20d supply, fill #0

## 2024-09-27 MED ORDER — FENTANYL CITRATE (PF) 100 MCG/2ML IJ SOLN
INTRAMUSCULAR | Status: DC | PRN
  Administered 2024-09-27: 25 ug via INTRAVENOUS
  Administered 2024-09-27: 50 ug via INTRAVENOUS
  Administered 2024-09-27: 25 ug via INTRAVENOUS

## 2024-09-27 MED ORDER — TRANEXAMIC ACID-NACL 1000-0.7 MG/100ML-% IV SOLN
1000.0000 mg | INTRAVENOUS | Status: AC
  Administered 2024-09-27: 1000 mg via INTRAVENOUS
  Filled 2024-09-27: qty 100

## 2024-09-27 MED ORDER — KETOROLAC TROMETHAMINE 30 MG/ML IJ SOLN
INTRAMUSCULAR | Status: AC
  Filled 2024-09-27: qty 1

## 2024-09-27 MED ORDER — MIDAZOLAM HCL 2 MG/2ML IJ SOLN
INTRAMUSCULAR | Status: AC
  Filled 2024-09-27: qty 2

## 2024-09-27 MED ORDER — OXYCODONE HCL 5 MG PO TABS: 5.0000 mg | ORAL_TABLET | Freq: Once | ORAL | Status: DC | PRN

## 2024-09-27 MED ORDER — BUPIVACAINE HCL (PF) 0.5 % IJ SOLN: INTRAMUSCULAR | Status: DC | PRN

## 2024-09-27 MED ORDER — ACETAMINOPHEN 500 MG PO TABS: 1000.0000 mg | ORAL_TABLET | Freq: Three times a day (TID) | ORAL | Status: DC

## 2024-09-27 MED ORDER — ALBUMIN HUMAN 5 % IV SOLN: INTRAVENOUS | Status: DC | PRN

## 2024-09-27 MED ORDER — SODIUM CHLORIDE (PF) 0.9 % IJ SOLN
INTRAMUSCULAR | Status: DC | PRN
  Administered 2024-09-27: 61 mL

## 2024-09-27 MED ORDER — CHLORHEXIDINE GLUCONATE 0.12 % MT SOLN
15.0000 mL | Freq: Once | OROMUCOSAL | Status: AC
  Administered 2024-09-27: 15 mL via OROMUCOSAL

## 2024-09-27 MED ORDER — ORAL CARE MOUTH RINSE: 15.0000 mL | Freq: Once | OROMUCOSAL | Status: AC

## 2024-09-27 MED ORDER — LACTATED RINGERS IV BOLUS
250.0000 mL | Freq: Once | INTRAVENOUS | Status: AC
  Administered 2024-09-27: 250 mL via INTRAVENOUS

## 2024-09-27 MED ORDER — LACTATED RINGERS IV SOLN: INTRAVENOUS | Status: DC

## 2024-09-27 MED ORDER — OXYCODONE HCL 5 MG PO TABS: 5.0000 mg | ORAL_TABLET | ORAL | Status: DC | PRN

## 2024-09-27 MED ORDER — SODIUM CHLORIDE (PF) 0.9 % IJ SOLN
INTRAMUSCULAR | Status: AC
  Filled 2024-09-27: qty 50

## 2024-09-27 MED ORDER — HYDROMORPHONE HCL 1 MG/ML IJ SOLN
0.2500 mg | INTRAMUSCULAR | Status: DC | PRN
  Administered 2024-09-27 (×2): 0.5 mg via INTRAVENOUS

## 2024-09-27 MED ORDER — FENTANYL CITRATE (PF) 100 MCG/2ML IJ SOLN
INTRAMUSCULAR | Status: AC
  Filled 2024-09-27: qty 2

## 2024-09-27 MED ORDER — ASPIRIN 81 MG PO TBEC
81.0000 mg | DELAYED_RELEASE_TABLET | Freq: Two times a day (BID) | ORAL | 0 refills | Status: AC
  Filled 2024-09-27: qty 84, 42d supply, fill #0

## 2024-09-27 MED ORDER — ACETAMINOPHEN 10 MG/ML IV SOLN: 1000.0000 mg | Freq: Once | INTRAVENOUS | Status: DC | PRN

## 2024-09-27 MED ORDER — 0.9 % SODIUM CHLORIDE (POUR BTL) OPTIME
TOPICAL | Status: DC | PRN
  Administered 2024-09-27: 1000 mL

## 2024-09-27 MED ORDER — PROPOFOL 1000 MG/100ML IV EMUL
INTRAVENOUS | Status: AC
  Filled 2024-09-27: qty 100

## 2024-09-27 MED ORDER — SENNOSIDES-DOCUSATE SODIUM 8.6-50 MG PO TABS
1.0000 | ORAL_TABLET | Freq: Every day | ORAL | 0 refills | Status: AC
  Filled 2024-09-27: qty 20, 20d supply, fill #0

## 2024-09-27 MED ORDER — HYDROMORPHONE HCL 1 MG/ML IJ SOLN
INTRAMUSCULAR | Status: AC
  Filled 2024-09-27: qty 1

## 2024-09-27 MED ORDER — OXYCODONE HCL 5 MG PO TABS
5.0000 mg | ORAL_TABLET | Freq: Four times a day (QID) | ORAL | 0 refills | Status: AC | PRN
  Filled 2024-09-27: qty 30, 8d supply, fill #0

## 2024-09-27 MED ORDER — LIDOCAINE HCL (CARDIAC) PF 100 MG/5ML IV SOSY
PREFILLED_SYRINGE | INTRAVENOUS | Status: DC | PRN
  Administered 2024-09-27: 40 mg via INTRAVENOUS

## 2024-09-27 MED ORDER — OXYCODONE HCL 5 MG PO TABS: 10.0000 mg | ORAL_TABLET | ORAL | Status: DC | PRN

## 2024-09-27 MED ORDER — DEXAMETHASONE SOD PHOSPHATE PF 10 MG/ML IJ SOLN
INTRAMUSCULAR | Status: DC | PRN
  Administered 2024-09-27: 8 mg via INTRAVENOUS

## 2024-09-27 MED ORDER — ACETAMINOPHEN 500 MG PO TABS
1000.0000 mg | ORAL_TABLET | Freq: Once | ORAL | Status: AC
  Administered 2024-09-27: 1000 mg via ORAL
  Filled 2024-09-27: qty 2

## 2024-09-27 MED ORDER — ONDANSETRON HCL 4 MG/2ML IJ SOLN
INTRAMUSCULAR | Status: DC | PRN
  Administered 2024-09-27: 4 mg via INTRAVENOUS

## 2024-09-27 MED ORDER — CEFAZOLIN SODIUM-DEXTROSE 2-4 GM/100ML-% IV SOLN
2.0000 g | INTRAVENOUS | Status: AC
  Administered 2024-09-27: 2 g via INTRAVENOUS
  Filled 2024-09-27: qty 100

## 2024-09-27 MED ORDER — IRRISEPT - 450ML BOTTLE WITH 0.05% CHG IN STERILE WATER, USP 99.95% OPTIME
TOPICAL | Status: DC | PRN
  Administered 2024-09-27: 450 mL

## 2024-09-27 MED ORDER — AMISULPRIDE (ANTIEMETIC) 5 MG/2ML IV SOLN: 10.0000 mg | Freq: Once | INTRAVENOUS | Status: DC | PRN

## 2024-09-27 MED ORDER — OXYCODONE HCL 5 MG/5ML PO SOLN: 5.0000 mg | Freq: Once | ORAL | Status: DC | PRN

## 2024-09-27 MED ORDER — BUPIVACAINE HCL (PF) 0.5 % IJ SOLN
INTRAMUSCULAR | Status: DC | PRN
  Administered 2024-09-27: 12 mg via INTRATHECAL

## 2024-09-27 MED ORDER — MIDAZOLAM HCL (PF) 2 MG/2ML IJ SOLN
INTRAMUSCULAR | Status: DC | PRN
  Administered 2024-09-27: 2 mg via INTRAVENOUS

## 2024-09-27 MED ORDER — SODIUM CHLORIDE 0.9 % IR SOLN
Status: DC | PRN
  Administered 2024-09-27: 1000 mL

## 2024-09-27 SURGICAL SUPPLY — 43 items
BAG COUNTER SPONGE SURGICOUNT (BAG) IMPLANT
BAG ZIPLOCK 12X15 (MISCELLANEOUS) ×1 IMPLANT
BLADE SAG 18X100X1.27 (BLADE) ×1 IMPLANT
BNDG COHESIVE 6X5 TAN ST LF (GAUZE/BANDAGES/DRESSINGS) IMPLANT
COVER PERINEAL POST (MISCELLANEOUS) ×1 IMPLANT
COVER SURGICAL LIGHT HANDLE (MISCELLANEOUS) ×1 IMPLANT
CUP ACETBLR 54 OD PINNACLE (Hips) IMPLANT
DERMABOND ADVANCED .7 DNX12 (GAUZE/BANDAGES/DRESSINGS) ×1 IMPLANT
DRAPE IMP U-DRAPE 54X76 (DRAPES) ×1 IMPLANT
DRAPE STERI IOBAN 125X83 (DRAPES) ×1 IMPLANT
DRAPE U-SHAPE 47X51 STRL (DRAPES) ×1 IMPLANT
DRESSING AQUACEL AG SP 3.5X10 (GAUZE/BANDAGES/DRESSINGS) IMPLANT
DRSG AQUACEL AG ADV 3.5X 6 (GAUZE/BANDAGES/DRESSINGS) IMPLANT
DURAPREP 26ML APPLICATOR (WOUND CARE) ×1 IMPLANT
ELECT COATED BLADE 2.86 ST (ELECTRODE) ×1 IMPLANT
ELECT REM PT RETURN 15FT ADLT (MISCELLANEOUS) ×1 IMPLANT
GLOVE BIO SURGEON STRL SZ8 (GLOVE) ×1 IMPLANT
GLOVE BIOGEL PI IND STRL 8 (GLOVE) ×1 IMPLANT
GOWN STRL REUS W/ TWL XL LVL3 (GOWN DISPOSABLE) ×2 IMPLANT
GOWN STRL SURGICAL XL XLNG (GOWN DISPOSABLE) ×1 IMPLANT
HEAD CERAMIC DELTA 36 PLUS 1.5 (Hips) IMPLANT
HOLDER FOLEY CATH W/STRAP (MISCELLANEOUS) ×1 IMPLANT
HOOD PEEL AWAY T7 (MISCELLANEOUS) ×3 IMPLANT
KIT TURNOVER KIT A (KITS) ×1 IMPLANT
LAVAGE JET IRRISEPT WOUND (IRRIGATION / IRRIGATOR) ×1 IMPLANT
LINER NEUTRAL 36ID 54OD (Liner) IMPLANT
MANIFOLD NEPTUNE II (INSTRUMENTS) ×1 IMPLANT
NDL SAFETY ECLIP 18X1.5 (MISCELLANEOUS) ×1 IMPLANT
PACK ANTERIOR HIP CUSTOM (KITS) ×1 IMPLANT
PAD ARMBOARD POSITIONER FOAM (MISCELLANEOUS) ×1 IMPLANT
PADDING CAST COTTON 6X4 STRL (CAST SUPPLIES) IMPLANT
PENCIL SMOKE EVACUATOR (MISCELLANEOUS) ×1 IMPLANT
SCREW 6.5MMX25MM (Screw) IMPLANT
SET HNDPC FAN SPRY TIP SCT (DISPOSABLE) ×1 IMPLANT
STEM FEM SZ3 STD ACTIS (Stem) IMPLANT
SUT MNCRL AB 3-0 PS2 18 (SUTURE) ×1 IMPLANT
SUT VIC AB 1 CT1 36 (SUTURE) ×2 IMPLANT
SUT VIC AB 2-0 CT1 TAPERPNT 27 (SUTURE) ×2 IMPLANT
SYR 3ML LL SCALE MARK (SYRINGE) ×1 IMPLANT
TAPE CLOTH 3X10 WHT NS LF (GAUZE/BANDAGES/DRESSINGS) ×1 IMPLANT
TOWEL GREEN STERILE FF (TOWEL DISPOSABLE) ×1 IMPLANT
TRAY FOLEY MTR SLVR 16FR STAT (SET/KITS/TRAYS/PACK) ×1 IMPLANT
TUBE SUCTION HIGH CAP CLEAR NV (SUCTIONS) ×1 IMPLANT

## 2024-09-27 NOTE — Op Note (Signed)
 OPERATIVE REPORT- TOTAL HIP ARTHROPLASTY   PREOPERATIVE DIAGNOSIS:  Left femoral head osteonecrosis with collapse  POSTOPERATIVE DIAGNOSIS:  Left femoral head osteonecrosis with collapse  PROCEDURE: Left total hip arthroplasty, anterior approach.   SURGEON: Rankin Pizza, MD   ASSISTANT: None  ANESTHESIA:  Spinal  ESTIMATED BLOOD LOSS:-200 mL    DRAINS: None  COMPLICATIONS: None   CONDITION: PACU - hemodynamically stable.   BRIEF CLINICAL NOTE: Scott Henry is a 54 y.o. male with Left  femoral head osteonecrosis with collapse and has tried and failed nonoperative treatment and has undergone appropriate pre operative clearances. The patient is indicated to undergo Left total hip arthroplasty to due to failure of extensive non operative management and the presence of subchondral collapse of the femoral head. Risks, benefits and alternatives were discussed with the patient and their family including, but not limited to bleeding, infection, damage to surrounding structures such as nerves, arteries, and veins, dislocation, periprosthetic fracture, loosening, hardware failure and the need for additional procedures. The patient expressed understanding and agreement with the plan and elected to proceed with Left total hip arthroplasty. Informed consent obtained.  The extremity was examined and found to be approximately equal in length compared to the contralateral with intact sensation and motor to the limb and 2+ dp pulse. The extremity was marked at this time.   PROCEDURE IN DETAIL: The patient was brought back to the operating room where they underwent general anesthesia per anesthesia protocol. The traction boots for the [facility] bed were placed on both feet well padded and the patient was placed onto the Tucker bed, boots placed into the leg holders. The Left hip was then isolated from the perineum with plastic drapes and prepped and draped in the usual sterile fashion. A time out was performed  confirming the patient, laterality, surgical site, surgeon initials, surgical procedure, administration of pre operative antibiotics and TXA, implants present, C-arm present and all present agreed.   ASIS and greater trochanter were marked and a oblique incision was made, starting at about 1 cm lateral and 2 cm distal to the ASIS and coursing towards the anterior cortex of the femur. The skin was cut with a 10 blade through subcutaneous tissue to the level of the fascia overlying the tensor fascia lata muscle. The fascia was then incised in line with the incision at the junction of the anterior third and posterior 2/3rd. The muscle was teased off the fascia and then the interval between the TFL  and the rectus was developed. The Hohmann retractor was then placed at the top of the femoral neck over the capsule. The vessels overlying the capsule were cauterized and the fat on top of the capsule was removed. A Hohmann retractor was then placed anterior underneath the rectus femoris to give exposure to the entire anterior capsule. The anterior capsule was excised and the femoral head and neck was identified. The femoral neck was then cut in situ with an oscillating saw. Traction was then applied to the left lower extremity utilizing the [facility] traction. The femoral head was then removed. The labrum was then excised with electrocautery. Under fluoroscopic guidance a starting reamer beginning at 48 mm was placed in the acetabulum to medialize the cup to the tear drop. Once adequately medialized, the reamer was then placed in 45 deg abduction and 20 deg anteversion and the acetabulum was successively reamed up to a 51 reamer with plan for a 54 mm cup.  A trial 54 cup was then placed with  excellent fit.  The final 54 mm sector porous acetabular cup was then impacted with approximately 45 deg abduction and 20 deg anterversion with excellent fixation. 1 screw measuring 25 mm was then drilled and placed in the cup in the  posterosuperior position and a  neutral trial liner was placed.  Satisfied with the acetabulum, attention was then turned to the femur.   The proximal femur was then delivered out of the wound a Khylan Sawyer handle femoral retractor was then placed along the medial calcar and the lateral aspect of the femur just distal to the vastus ridge. The leg was  externally rotated and then placed in an extended and adducted position essentially delivering the femur.  We also removed the capsule superiorly and the piriformis from the piriformis fossa to gain excellent exposure of the proximal femur. The proximal femur was then delivered out of the wound a Stefan Karen handle femoral retractor was then placed along the medial calcar and the lateral aspect of the femur just distal to the vastus ridge along with the matta retractor proximally behind the greater trochanter. Rongeur was used to remove some cancellous bone to get into the lateral portion of the proximal femur for placement of the initial starter reamer. The starter broaches was placed  the starter broach  and was shown to go down the center of the canal. Broaching with the Depuy Actis system was then performed starting at size 0 coursing up to size 3. A size 3 stem had excellent torsional and rotational and axial stability. The trial standard offset neck was then placed  with a 36 +1.5 mm trial head. The hip was then reduced. We confirmed that the stem was in the canal both on AP and lateral x-rays. It also has excellent sizing. The hip was reduced with outstanding stability through full extension and full external rotation.. AP pelvis was taken and the leg lengths were measured and found to be equal. Hip was then dislocated again and the femoral head and neck removed. A calcar planar was utilized to ensure the calcar was appropriate height with the trial stem and the stem was again tested with the broach handle ensuring adequate rotational stability. The femoral broach was  removed. The wound was copiously irrigated with irrisept and normal saline, the trial acetabular liner was removed and exchanged for a size 36 x 52 mm neutral polyethylene liner which had excellent fixation at impaction. A size 3 Actis collared stem with a standard offset neck was then impacted into the femur following native anteversion. Has excellent purchase in the canal wtih torsional, rotational and axial stability. It is confirmed to be in the canal on AP and lateral fluoroscopic views. The 36 +1.5 mm ceramic head was  then placed and the hip reduced with outstanding stability. Again AP pelvis was taken and it confirmed that the leg lengths were equal. The wound was then copiously irrigated with saline solution. The deep tissue was closed 0 vicryl.  30 ml of .25% Bupivicaine with toradol was  injected into the capsule and into the edge of the tensor fascia lata as well as subcutaneous tissue. The fascia overlying the tensor fascia lata was then closed with a running #1vicryl suture. Subcu was closed with interrupted 2-0 Vicryl and subcuticular running 4-0 Monocryl. Incision was cleaned and dried. Skin glue and an aquacel dressing was applied.. The patient was awakened and transported to recovery in stable condition.      Post operative plan: The patient is planned as  an outpatient total hip arthroplasty. They will mobilize with PT post operatively and when appropriate discharge.  Oxycodone  and tylenol  scripts have been sent along with a stool softener.  We will plan for aspirin 81 mg BID for DVT ppx x 6 weeks and the patient will return to clinic in 2 weeks for skin check.  Rankin Pizza, M.D.

## 2024-09-27 NOTE — Discharge Instructions (Signed)
 Orthopedic surgery discharge instructions:  Bandages: -Maintain postoperative bandage until follow-up appointment.  - Do not use any lotions or creams on or around the incision until instructed by your surgeon.  Showering -  This is waterproof, and you may begin showering on postoperative day #3. You should keep the bandage in place wrap it in plastic wrap at that point prior to showering  - Do not submerge the surgical site underwater.    Activity Restrictions - Weight bearing as tolerated to the left lower extremity  Medications - For mild to moderate pain use Tylenol  as needed around-the-clock, 1000 mg every 8 h for pain. Do not take more than this higher levels can affect kidney function - For breakthrough pain use oxycodone  as necessary. - Please take a stool softener such as docusate and senna while taking a narcotic pain medication - Aspirin 81 mg twice daily x 6 weeks to avoid blood clots  Follow Up:  -You will return to see Dr. Teresa in the office in 2 weeks for routine postoperative check with x-rays.

## 2024-09-27 NOTE — Anesthesia Postprocedure Evaluation (Signed)
"   Anesthesia Post Note  Patient: Eyal Macwilliams  Procedure(s) Performed: ARTHROPLASTY, HIP, TOTAL, ANTERIOR APPROACH (Left: Hip)     Patient location during evaluation: PACU Anesthesia Type: Spinal Level of consciousness: oriented and awake and alert Pain management: pain level controlled Vital Signs Assessment: post-procedure vital signs reviewed and stable Respiratory status: spontaneous breathing and respiratory function stable Cardiovascular status: blood pressure returned to baseline and stable Postop Assessment: no headache, no backache, no apparent nausea or vomiting and patient able to bend at knees Anesthetic complications: no   No notable events documented.  Last Vitals:  Vitals:   09/27/24 1215 09/27/24 1220  BP: 131/79 127/81  Pulse: 61 68  Resp: 14 16  Temp:  36.6 C  SpO2: 100% 99%    Last Pain:  Vitals:   09/27/24 1245  TempSrc:   PainSc: 0-No pain                 Garnette LABOR Cherri Yera      "

## 2024-09-27 NOTE — Transfer of Care (Signed)
 Immediate Anesthesia Transfer of Care Note  Patient: Scott Henry  Procedure(s) Performed: Procedures: ARTHROPLASTY, HIP, TOTAL, ANTERIOR APPROACH (Left)  Patient Location: PACU  Anesthesia Type:Spinal  Level of Consciousness:  sedated, patient cooperative and responds to stimulation  Airway & Oxygen Therapy:Patient Spontanous Breathing and Patient connected to face mask oxgen  Post-op Assessment:  Report given to PACU RN and Post -op Vital signs reviewed and stable  Post vital signs:  Reviewed and stable  Last Vitals:  Vitals:   09/27/24 1100  BP: 125/75  Pulse: 69  Resp: 16  Temp: 36.6 C  SpO2: 97%    Complications: No apparent anesthesia complications

## 2024-09-27 NOTE — Evaluation (Signed)
 Physical Therapy Evaluation Patient Details Name: Scott Henry MRN: [id number] DOB: [birth year] Today's Date: 09/27/2024  History of Present Illness  54 yo male presents to therapy s/p L THA anterior approach on 09/27/2024 due to failure of conservative measures. Pt PMH includes but is not limited to: anxiety, OA, HLD, and HTN.  Clinical Impression    Scott Henry is a 54 y.o. male POD 0 s/p L THA. Patient reports IND with mobility at baseline. Patient is now limited by functional impairments (see PT problem list below) and requires CGA for transfers and gait with RW. Patient was able to ambulate 55 feet with RW and CGA and cues for safe walker management. Patient educated on safe sequencing for stair mobility with R handrail, fall risk prevention, slowly increasing activity level, use of RW for safety and support, use of CP/ice, pain management and car transfers pt  and significant other verbalized understanding of safe guarding position for people assisting with mobility. Patient instructed in exercises to facilitate ROM and circulation reviewed and HO provided. Patient will benefit from continued skilled PT interventions to address impairments and progress towards PLOF. Patient has met mobility goals at adequate level for discharge home with family support and OPPT services; will continue to follow if pt continues acute stay to progress towards Mod I goals.       If plan is discharge home, recommend the following: A little help with walking and/or transfers;A little help with bathing/dressing/bathroom;Assistance with cooking/housework;Assist for transportation;Help with stairs or ramp for entrance   Can travel by private vehicle        Equipment Recommendations Rolling walker (2 wheels)  Recommendations for Other Services       Functional Status Assessment Patient has had a recent decline in their functional status and demonstrates the ability to make significant improvements in function in a  reasonable and predictable amount of time.     Precautions / Restrictions Precautions Precautions: Fall Restrictions Weight Bearing Restrictions Per Provider Order: No      Mobility  Bed Mobility Overal bed mobility: Needs Assistance Bed Mobility: Supine to Sit     Supine to sit: Supervision     General bed mobility comments: min cues    Transfers Overall transfer level: Needs assistance Equipment used: Rolling walker (2 wheels) Transfers: Sit to/from Stand Sit to Stand: Contact guard assist           General transfer comment: min cues, inital sit to stand posterior LOB and min A to recover, no subsequent LOB with functional mobiltiy tasks    Ambulation/Gait Ambulation/Gait assistance: Contact guard assist Gait Distance (Feet): 55 Feet Assistive device: Rolling walker (2 wheels) Gait Pattern/deviations: Step-to pattern, Decreased stance time - left, Antalgic, Trunk flexed Gait velocity: decreased     General Gait Details: slight trunk flexion with B UE support at RW to offload L LE in stance phase, min cues for safety, RW management and use of RW  Stairs Stairs: Yes Stairs assistance: Contact guard assist Stair Management: Two rails Number of Stairs: 3 General stair comments: step navigation initiated with B handrails and min cues for safety, sequencing and technique, pt able to progress to R handrail only with cues as above for step to pattern  Wheelchair Mobility     Tilt Bed    Modified Rankin (Stroke Patients Only)       Balance Overall balance assessment: Needs assistance Sitting-balance support: Feet supported Sitting balance-Leahy Scale: Good     Standing balance support: Bilateral upper  extremity supported, During functional activity, Reliant on assistive device for balance Standing balance-Leahy Scale: Poor                               Pertinent Vitals/Pain Pain Assessment Pain Assessment: 0-10 Pain Score: 5  Pain  Location: L hip and LE Pain Descriptors / Indicators: Aching, Discomfort, Constant, Operative site guarding Pain Intervention(s): Limited activity within patient's tolerance, Monitored during session, Premedicated before session, Repositioned, Ice applied    Home Living Family/patient expects to be discharged to:: Private residence Living Arrangements: Spouse/significant other Available Help at Discharge: Family Type of Home: House Home Access: Stairs to enter Entrance Stairs-Rails: Right Entrance Stairs-Number of Steps: 4   Home Layout: One level Home Equipment: Toilet riser (bed rail, step to access bed)      Prior Function Prior Level of Function : Independent/Modified Independent;Working/employed;Driving             Mobility Comments: IND no AD for all ADLs self care tasks and IADLs       Extremity/Trunk Assessment        Lower Extremity Assessment Lower Extremity Assessment: LLE deficits/detail LLE Deficits / Details: ankle DF/PF 5/5 LLE Sensation: WNL       Communication   Communication Communication: No apparent difficulties    Cognition Arousal: Alert Behavior During Therapy: WFL for tasks assessed/performed   PT - Cognitive impairments: No apparent impairments                         Following commands: Intact       Cueing       General Comments      Exercises Total Joint Exercises Ankle Circles/Pumps: AROM, Both, 15 reps Quad Sets: AROM, Left, 5 reps Heel Slides: AROM, Left, 5 reps Hip ABduction/ADduction: AROM, Left, 5 reps, Standing Long Arc Quad: AROM, Left, 5 reps, Seated Knee Flexion: AROM, Left, 5 reps, Standing Marching in Standing: AROM, Left, 5 reps, Standing Standing Hip Extension: AROM, Left, 5 reps, Standing   Assessment/Plan    PT Assessment Patient needs continued PT services  PT Problem List Decreased range of motion;Decreased strength;Decreased activity tolerance;Decreased balance;Decreased mobility;Pain        PT Treatment Interventions DME instruction;Gait training;Stair training;Functional mobility training;Therapeutic activities;Therapeutic exercise;Balance training;Neuromuscular re-education;Patient/family education;Modalities    PT Goals (Current goals can be found in the Care Plan section)  Acute Rehab PT Goals Patient Stated Goal: to be able to get inout of the car more easily, sleep PT Goal Formulation: With patient Time For Goal Achievement: 10/11/24 Potential to Achieve Goals: Good    Frequency 7X/week     Co-evaluation               AM-PAC PT 6 Clicks Mobility  Outcome Measure Help needed turning from your back to your side while in a flat bed without using bedrails?: None Help needed moving from lying on your back to sitting on the side of a flat bed without using bedrails?: A Little Help needed moving to and from a bed to a chair (including a wheelchair)?: A Little Help needed standing up from a chair using your arms (e.g., wheelchair or bedside chair)?: A Little Help needed to walk in hospital room?: A Little Help needed climbing 3-5 steps with a railing? : A Little 6 Click Score: 19    End of Session Equipment Utilized During Treatment: Gait belt Activity Tolerance: Patient  tolerated treatment well;No increased pain Patient left: in chair;with call bell/phone within reach;with family/visitor present Nurse Communication: Mobility status;Other (comment) (pt readiness for same day d/c from PT standpoint) PT Visit Diagnosis: Unsteadiness on feet (R26.81);Other abnormalities of gait and mobility (R26.89);Muscle weakness (generalized) (M62.81);Difficulty in walking, not elsewhere classified (R26.2);Pain Pain - Right/Left: Left Pain - part of body: Leg;Hip    Time: 1341-1420 PT Time Calculation (min) (ACUTE ONLY): 39 min   Charges:   PT Evaluation $PT Eval Low Complexity: 1 Low PT Treatments $Gait Training: 8-22 mins $Therapeutic Exercise: 8-22 mins PT  General Charges $$ ACUTE PT VISIT: 1 Visit         [place], PT Acute Rehab   [place] VEAR Drone 09/27/2024, 3:00 PM

## 2024-09-27 NOTE — Anesthesia Procedure Notes (Addendum)
 Spinal  Patient location during procedure: OR Start time: 09/27/2024 7:23 AM End time: 09/27/2024 7:32 AM Reason for block: surgical anesthesia  Staffing Performed: resident/CRNA  Authorized by: Jefm Garnette LABOR, MD   Performed by: Vincenzo Show, CRNA  Preanesthetic Checklist Completed: patient identified, IV checked, site marked, risks and benefits discussed, surgical consent, monitors and equipment checked, pre-op evaluation and timeout performed Spinal Block Patient position: sitting Prep: DuraPrep Patient monitoring: heart rate, cardiac monitor, continuous pulse ox and blood pressure Approach: midline Location: L3-4 Injection technique: single-shot Needle Needle type: Spinocan  Needle gauge: 24 G Needle length: 9 cm Needle insertion depth (cm): 7 Assessment Sensory level: T6 Events: CSF return  Additional Notes -heme, -para, VSS.  Lot # and expiration date OK.

## 2024-09-28 DIAGNOSIS — M879 Osteonecrosis, unspecified: Secondary | ICD-10-CM | POA: Insufficient documentation

## 2024-10-03 ENCOUNTER — Encounter (HOSPITAL_COMMUNITY): Payer: Self-pay
